# Patient Record
Sex: Male | Born: 1960 | Race: White | Hispanic: No | Marital: Married | Smoking: Never smoker
Health system: Southern US, Community
[De-identification: ages and names within clinical notes are randomized; demographics above are authoritative.]

## PROBLEM LIST (undated history)

## (undated) DIAGNOSIS — M25529 Pain in unspecified elbow: Secondary | ICD-10-CM

## (undated) DIAGNOSIS — K5792 Diverticulitis of intestine, part unspecified, without perforation or abscess without bleeding: Secondary | ICD-10-CM

## (undated) DIAGNOSIS — G8929 Other chronic pain: Secondary | ICD-10-CM

## (undated) DIAGNOSIS — M1990 Unspecified osteoarthritis, unspecified site: Secondary | ICD-10-CM

## (undated) DIAGNOSIS — J302 Other seasonal allergic rhinitis: Secondary | ICD-10-CM

## (undated) DIAGNOSIS — K635 Polyp of colon: Secondary | ICD-10-CM

## (undated) HISTORY — PX: REPAIR EXTENSOR TENDON: SHX5382

## (undated) HISTORY — DX: Other seasonal allergic rhinitis: J30.2

---
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
## 1998-09-04 ENCOUNTER — Emergency Department (HOSPITAL_COMMUNITY): Admission: EM | Admit: 1998-09-04 | Discharge: 1998-09-04 | Payer: Self-pay | Admitting: Internal Medicine

## 1998-10-29 HISTORY — PX: OTHER SURGICAL HISTORY: SHX169

## 1999-08-31 ENCOUNTER — Ambulatory Visit (HOSPITAL_COMMUNITY): Admission: RE | Admit: 1999-08-31 | Discharge: 1999-08-31 | Payer: Self-pay | Admitting: *Deleted

## 1999-08-31 ENCOUNTER — Encounter: Payer: Self-pay | Admitting: *Deleted

## 2000-10-14 ENCOUNTER — Encounter: Payer: Self-pay | Admitting: Gastroenterology

## 2000-10-14 ENCOUNTER — Ambulatory Visit (HOSPITAL_COMMUNITY): Admission: RE | Admit: 2000-10-14 | Discharge: 2000-10-14 | Payer: Self-pay | Admitting: Gastroenterology

## 2004-12-06 ENCOUNTER — Ambulatory Visit: Admission: RE | Admit: 2004-12-06 | Discharge: 2004-12-06 | Payer: Self-pay | Admitting: Family Medicine

## 2008-04-19 HISTORY — PX: OTHER SURGICAL HISTORY: SHX169

## 2010-10-10 ENCOUNTER — Encounter: Payer: Self-pay | Admitting: Gastroenterology

## 2011-02-28 ENCOUNTER — Other Ambulatory Visit: Payer: Self-pay | Admitting: Orthopedic Surgery

## 2011-02-28 ENCOUNTER — Encounter (HOSPITAL_COMMUNITY): Payer: PRIVATE HEALTH INSURANCE | Attending: Orthopedic Surgery

## 2011-02-28 DIAGNOSIS — Z01811 Encounter for preprocedural respiratory examination: Secondary | ICD-10-CM | POA: Insufficient documentation

## 2011-02-28 DIAGNOSIS — Z01812 Encounter for preprocedural laboratory examination: Secondary | ICD-10-CM | POA: Insufficient documentation

## 2011-02-28 DIAGNOSIS — M24159 Other articular cartilage disorders, unspecified hip: Secondary | ICD-10-CM | POA: Insufficient documentation

## 2011-02-28 LAB — CBC
HCT: 41 % (ref 39.0–52.0)
Hemoglobin: 14.5 g/dL (ref 13.0–17.0)
MCH: 31 pg (ref 26.0–34.0)
MCHC: 35.4 g/dL (ref 30.0–36.0)
MCV: 87.6 fL (ref 78.0–100.0)
Platelets: 213 10*3/uL (ref 150–400)
RBC: 4.68 MIL/uL (ref 4.22–5.81)
RDW: 11.8 % (ref 11.5–15.5)
WBC: 5.8 10*3/uL (ref 4.0–10.5)

## 2011-02-28 LAB — URINALYSIS, ROUTINE W REFLEX MICROSCOPIC
Bilirubin Urine: NEGATIVE
Glucose, UA: NEGATIVE mg/dL
Hgb urine dipstick: NEGATIVE
Ketones, ur: NEGATIVE mg/dL
Nitrite: NEGATIVE
Protein, ur: NEGATIVE mg/dL
Specific Gravity, Urine: 1.005 (ref 1.005–1.030)
Urobilinogen, UA: 0.2 mg/dL (ref 0.0–1.0)
pH: 6.5 (ref 5.0–8.0)

## 2011-02-28 LAB — APTT: aPTT: 28 seconds (ref 24–37)

## 2011-02-28 LAB — COMPREHENSIVE METABOLIC PANEL
ALT: 23 U/L (ref 0–53)
AST: 21 U/L (ref 0–37)
Albumin: 4.5 g/dL (ref 3.5–5.2)
Alkaline Phosphatase: 68 U/L (ref 39–117)
BUN: 9 mg/dL (ref 6–23)
CO2: 26 mEq/L (ref 19–32)
Calcium: 9.5 mg/dL (ref 8.4–10.5)
Chloride: 97 mEq/L (ref 96–112)
Creatinine, Ser: 0.85 mg/dL (ref 0.4–1.5)
GFR calc Af Amer: >60 mL/min (ref >60)
GFR calc non Af Amer: >60 mL/min (ref >60)
Glucose, Bld: 85 mg/dL (ref 70–99)
Potassium: 4.3 mEq/L (ref 3.5–5.1)
Sodium: 134 mEq/L — ABNORMAL LOW (ref 135–145)
Total Bilirubin: 0.6 mg/dL (ref 0.3–1.2)
Total Protein: 7.3 g/dL (ref 6.0–8.3)

## 2011-02-28 LAB — PROTIME-INR
INR: 0.95 (ref 0.00–1.49)
Prothrombin Time: 12.9 seconds (ref 11.6–15.2)

## 2011-02-28 LAB — SURGICAL PCR SCREEN
MRSA, PCR: NEGATIVE
Staphylococcus aureus: NEGATIVE

## 2011-03-09 ENCOUNTER — Ambulatory Visit (HOSPITAL_COMMUNITY)
Admission: RE | Admit: 2011-03-09 | Discharge: 2011-03-09 | Disposition: A | Discharge status: Home / Self Care | Payer: PRIVATE HEALTH INSURANCE | Source: Ambulatory Visit | Attending: Orthopedic Surgery | Admitting: Orthopedic Surgery

## 2011-03-09 ENCOUNTER — Ambulatory Visit (HOSPITAL_COMMUNITY): Payer: PRIVATE HEALTH INSURANCE

## 2011-03-09 DIAGNOSIS — M25559 Pain in unspecified hip: Secondary | ICD-10-CM

## 2011-03-09 DIAGNOSIS — M24159 Other articular cartilage disorders, unspecified hip: Secondary | ICD-10-CM | POA: Insufficient documentation

## 2011-03-09 DIAGNOSIS — M9420 Chondromalacia, unspecified site: Secondary | ICD-10-CM | POA: Insufficient documentation

## 2011-03-09 HISTORY — PX: HIP ARTHROSCOPY W/ LABRAL DEBRIDEMENT: SHX1749

## 2011-03-14 NOTE — Op Note (Signed)
Douglas Shah, Douglas Shah               ACCOUNT NO.:  [id number]  MEDICAL RECORD NO.:  [id number]           PATIENT TYPE:  O  LOCATION:  DAYL                         FACILITY:  [facility]  PHYSICIAN:  Ollen Gross, M.D.    DATE OF BIRTH:  1961-07-04  DATE OF PROCEDURE:  03/09/2011 DATE OF DISCHARGE:                              OPERATIVE REPORT   PREOPERATIVE DIAGNOSIS:  Right hip labral tear.  POSTOPERATIVE DIAGNOSES:  Right hip labral tear plus chondral defect, acetabulum.  PROCEDURE:  Right hip arthroscopy with labral debridement and chondroplasty.  SURGEON:  Ollen Gross, M.D.  ASSISTANT:  Avel Peace, PA-C  ANESTHESIA:  General.  ESTIMATED BLOOD LOSS:  Minimal.  DRAINS:  None.  COMPLICATIONS:  None.  CONDITION:  Stable to recovery room.  CLINICAL NOTE:  Douglas Shah is a 50 year old male, avid runner, who has developed significant right hip pain with mechanical type symptoms. Exam and history suggested labral tear confirmed by MRI.  He presents for arthroscopy and debridement for his repair.  PROCEDURE IN DETAIL:  After successful administration of general anesthetic, the patient is placed in left lateral decubitus position with the right side up.  His right lower extremity is draped over the well-padded perineal post and foot placed into the well-padded foot holder.  The lower leg is well padded also.  Under fluoroscopic guidance, traction is then applied to the right lower extremity to adequately distract the hip.  Once adequately distracted, the traction is locked in this position.  C-arm was then removed.  The thigh is then prepped and draped in the usual sterile fashion.  Spinal needles were passed to the sites marked for the anterior posterior peritrochanteric portals.  Saline is then passed through the posterior needle and his outflow through the anterior needle confirming both are intra-articular. Small incisions were made around posterior needle.  The introducer  is passed into the joint and then the cannula.  Camera is then passed through the cannula and arthroscopic visualization proceeds.  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  There is some grade 1 and 2 chondromalacia throughout the anterior aspect acetabulum, and then at the labral-chondral junction, there is a chondral delamination from about the 2 o'clock to the 5 o'clock position.  The spinal needle was felt to be in good position anteriorly, so I then created the portal anteriorly.  The unstable cartilage is debrided back to a stable bony base with stable cartilaginous edges.  There is some fraying of the labrum with a small tear but it is not detached.  His anatomy does not have any acetabular spurring, so I felt it was not necessary to remove the labrum, takes spurs, and reattach.  The labrum had a firm attachment, so we left it intact.  I did debride the tear down to a stable base.  We then shaved down the exposed bone and the acetabulum.  The entire area is about 1 x 2 cm along the edge from the 2 o'clock to 5 o'clock position.  Rest of the joints inspected, and rest of the cartilage looks normal.  The camera is removed and then the  20 cc of 0.25% Marcaine with epinephrine injected through the inflow cannula into the joint.  That is removed and traction is taken off the joint. The incision is then closed with interrupted 4-0 nylon.  The incision is clean and dry, and a bulky sterile dressing is applied.  He is then awakened and transported to recovery in stable condition.     Ollen Gross, M.D.     FA/MEDQ  D:  03/09/2011  T:  03/09/2011  Job:  [id number]  Electronically Signed by Ollen Gross M.D. on 03/14/2011 10:13:03 AM

## 2011-04-23 NOTE — Letter (Signed)
Summary: Colonoscopy Date Change Letter  [facility]  [address]   [place], [place] [address]   Phone: [phone]  Fax: [phone]      October 10, 2010 MRN: [id number]   [facility] [address] [place], [place]  [address]   Dear Douglas Shah,   Previously you were recommended to have a repeat colonoscopy around this time. Your chart was recently reviewed by Dr. Russella Dar of [facility]. Follow up colonoscopy is now recommended in September 2012. This revised recommendation is based on current, nationally recognized guidelines for colorectal cancer screening and polyp surveillance. These guidelines are endorsed by the American Cancer Society, [employer] on Colorectal Cancer as well as numerous other major medical organizations.  Please understand that our recommendation assumes that you do not have any new symptoms such as bleeding, a change in bowel habits, anemia, or significant abdominal discomfort. If you do have any concerning GI symptoms or want to discuss the guideline recommendations, please call to arrange an office visit at your earliest convenience. Otherwise we will keep you in our reminder system and contact you 1-2 months prior to the date listed above to schedule your next colonoscopy.  Thank you,   Claudette Head, M.D.  [facility] [phone]

## 2011-07-31 ENCOUNTER — Encounter: Payer: Self-pay | Admitting: Gastroenterology

## 2012-07-22 ENCOUNTER — Encounter: Payer: Self-pay | Admitting: Gastroenterology

## 2012-08-04 ENCOUNTER — Other Ambulatory Visit: Payer: Self-pay | Admitting: Orthopedic Surgery

## 2012-08-12 ENCOUNTER — Encounter (HOSPITAL_BASED_OUTPATIENT_CLINIC_OR_DEPARTMENT_OTHER): Payer: Self-pay | Admitting: *Deleted

## 2012-08-12 NOTE — Progress Notes (Signed)
NPO AFTER MN WITH CLEAR LIQUIDS UNTIL 0800 AND NO SNUFF (NO CREAM/ MILK PRODUCTS) . ARRIVES AT 1215. NEEDS HG.

## 2012-08-15 ENCOUNTER — Encounter (HOSPITAL_BASED_OUTPATIENT_CLINIC_OR_DEPARTMENT_OTHER): Payer: Self-pay | Admitting: Anesthesiology

## 2012-08-15 ENCOUNTER — Ambulatory Visit (HOSPITAL_BASED_OUTPATIENT_CLINIC_OR_DEPARTMENT_OTHER)
Admission: RE | Admit: 2012-08-15 | Discharge: 2012-08-15 | Disposition: A | Discharge status: Home / Self Care | Payer: PRIVATE HEALTH INSURANCE | Source: Ambulatory Visit | Attending: Orthopedic Surgery | Admitting: Orthopedic Surgery

## 2012-08-15 ENCOUNTER — Ambulatory Visit (HOSPITAL_BASED_OUTPATIENT_CLINIC_OR_DEPARTMENT_OTHER): Payer: PRIVATE HEALTH INSURANCE | Admitting: Anesthesiology

## 2012-08-15 ENCOUNTER — Encounter (HOSPITAL_BASED_OUTPATIENT_CLINIC_OR_DEPARTMENT_OTHER): Payer: Self-pay | Admitting: *Deleted

## 2012-08-15 ENCOUNTER — Encounter (HOSPITAL_BASED_OUTPATIENT_CLINIC_OR_DEPARTMENT_OTHER)
Admission: RE | Disposition: A | Discharge status: Home / Self Care | Payer: Self-pay | Source: Ambulatory Visit | Attending: Orthopedic Surgery

## 2012-08-15 DIAGNOSIS — R29898 Other symptoms and signs involving the musculoskeletal system: Secondary | ICD-10-CM

## 2012-08-15 DIAGNOSIS — M7710 Lateral epicondylitis, unspecified elbow: Secondary | ICD-10-CM | POA: Insufficient documentation

## 2012-08-15 HISTORY — PX: REPAIR EXTENSOR TENDON: SHX5382

## 2012-08-15 HISTORY — DX: Pain in unspecified elbow: M25.529

## 2012-08-15 HISTORY — DX: Unspecified osteoarthritis, unspecified site: M19.90

## 2012-08-15 HISTORY — DX: Other chronic pain: G89.29

## 2012-08-15 LAB — POCT HEMOGLOBIN-HEMACUE: Hemoglobin: 15.3 g/dL (ref 13.0–17.0)

## 2012-08-15 SURGERY — REPAIR, TENDON, EXTENSOR
Anesthesia: General | Site: Elbow | Laterality: Left | Wound class: Clean

## 2012-08-15 MED ORDER — POVIDONE-IODINE 7.5 % EX SOLN: Freq: Once | CUTANEOUS | Status: DC

## 2012-08-15 MED ORDER — DEXAMETHASONE SODIUM PHOSPHATE 4 MG/ML IJ SOLN
INTRAMUSCULAR | Status: DC | PRN
  Administered 2012-08-15: 10 mg via INTRAVENOUS

## 2012-08-15 MED ORDER — OXYCODONE HCL 5 MG/5ML PO SOLN: 5.0000 mg | Freq: Once | ORAL | Status: DC | PRN

## 2012-08-15 MED ORDER — LIDOCAINE HCL (CARDIAC) 20 MG/ML IV SOLN
INTRAVENOUS | Status: DC | PRN
  Administered 2012-08-15: 75 mg via INTRAVENOUS

## 2012-08-15 MED ORDER — PROPOFOL 10 MG/ML IV BOLUS
INTRAVENOUS | Status: DC | PRN
  Administered 2012-08-15: 250 mg via INTRAVENOUS

## 2012-08-15 MED ORDER — LACTATED RINGERS IV SOLN
INTRAVENOUS | Status: DC
  Administered 2012-08-15 (×3): via INTRAVENOUS

## 2012-08-15 MED ORDER — BUPIVACAINE HCL 0.5 % IJ SOLN
INTRAMUSCULAR | Status: DC | PRN
  Administered 2012-08-15: 6 mL

## 2012-08-15 MED ORDER — MEPERIDINE HCL 25 MG/ML IJ SOLN: 6.2500 mg | INTRAMUSCULAR | Status: DC | PRN

## 2012-08-15 MED ORDER — METHOCARBAMOL 500 MG PO TABS: 500.0000 mg | ORAL_TABLET | Freq: Four times a day (QID) | ORAL | Status: DC

## 2012-08-15 MED ORDER — OXYCODONE-ACETAMINOPHEN 5-325 MG PO TABS
1.0000 | ORAL_TABLET | ORAL | Status: DC | PRN
  Administered 2012-08-15: 1 via ORAL

## 2012-08-15 MED ORDER — OXYCODONE HCL 5 MG PO TABS
5.0000 mg | ORAL_TABLET | Freq: Once | ORAL | Status: AC | PRN
  Administered 2012-08-15: 2.5 mg via ORAL

## 2012-08-15 MED ORDER — MIDAZOLAM HCL 5 MG/5ML IJ SOLN
INTRAMUSCULAR | Status: DC | PRN
  Administered 2012-08-15: 1 mg via INTRAVENOUS

## 2012-08-15 MED ORDER — ACETAMINOPHEN 10 MG/ML IV SOLN: 1000.0000 mg | Freq: Once | INTRAVENOUS | Status: DC | PRN

## 2012-08-15 MED ORDER — OXYCODONE HCL 5 MG/5ML PO SOLN: 5.0000 mg | Freq: Once | ORAL | Status: AC | PRN

## 2012-08-15 MED ORDER — OXYCODONE-ACETAMINOPHEN 7.5-325 MG PO TABS: 1.0000 | ORAL_TABLET | ORAL | Status: DC | PRN

## 2012-08-15 MED ORDER — OXYCODONE HCL 5 MG PO TABS: 5.0000 mg | ORAL_TABLET | Freq: Once | ORAL | Status: DC | PRN

## 2012-08-15 MED ORDER — HYDROMORPHONE HCL PF 1 MG/ML IJ SOLN
0.2500 mg | INTRAMUSCULAR | Status: DC | PRN
  Administered 2012-08-15: 0.5 mg via INTRAVENOUS

## 2012-08-15 MED ORDER — ONDANSETRON HCL 4 MG/2ML IJ SOLN
INTRAMUSCULAR | Status: DC | PRN
  Administered 2012-08-15: 4 mg via INTRAVENOUS

## 2012-08-15 MED ORDER — OXYCODONE HCL 5 MG PO TABS: 5.0000 mg | ORAL_TABLET | Freq: Once | ORAL | Status: DC

## 2012-08-15 MED ORDER — PROMETHAZINE HCL 25 MG/ML IJ SOLN: 6.2500 mg | INTRAMUSCULAR | Status: DC | PRN

## 2012-08-15 MED ORDER — HYDROMORPHONE HCL PF 1 MG/ML IJ SOLN: 0.2500 mg | INTRAMUSCULAR | Status: DC | PRN

## 2012-08-15 MED ORDER — FENTANYL CITRATE 0.05 MG/ML IJ SOLN
INTRAMUSCULAR | Status: DC | PRN
  Administered 2012-08-15: 25 ug via INTRAVENOUS
  Administered 2012-08-15: 50 ug via INTRAVENOUS
  Administered 2012-08-15 (×3): 25 ug via INTRAVENOUS
  Administered 2012-08-15: 50 ug via INTRAVENOUS

## 2012-08-15 MED ORDER — KETOROLAC TROMETHAMINE 30 MG/ML IJ SOLN
INTRAMUSCULAR | Status: DC | PRN
  Administered 2012-08-15: 30 mg via INTRAVENOUS

## 2012-08-15 SURGICAL SUPPLY — 62 items
BANDAGE CONFORM 3  STR LF (GAUZE/BANDAGES/DRESSINGS) IMPLANT
BANDAGE ELASTIC 3 VELCRO ST LF (GAUZE/BANDAGES/DRESSINGS) IMPLANT
BANDAGE ELASTIC 4 VELCRO ST LF (GAUZE/BANDAGES/DRESSINGS) ×1 IMPLANT
BANDAGE ELASTIC 6 VELCRO ST LF (GAUZE/BANDAGES/DRESSINGS) ×1 IMPLANT
BLADE SURG 15 STRL LF DISP TIS (BLADE) ×1 IMPLANT
BLADE SURG 15 STRL SS (BLADE) ×4
BNDG CMPR 9X4 STRL LF SNTH (GAUZE/BANDAGES/DRESSINGS) ×1
BNDG COHESIVE 4X5 TAN NS LF (GAUZE/BANDAGES/DRESSINGS) ×2 IMPLANT
BNDG ESMARK 4X9 LF (GAUZE/BANDAGES/DRESSINGS) ×2 IMPLANT
CANISTER SUCTION 1200CC (MISCELLANEOUS) ×3 IMPLANT
CANISTER SUCTION 2500CC (MISCELLANEOUS) ×1 IMPLANT
CLOTH BEACON ORANGE TIMEOUT ST (SAFETY) ×2 IMPLANT
CORDS BIPOLAR (ELECTRODE) ×2 IMPLANT
COVER TABLE BACK 60X90 (DRAPES) ×2 IMPLANT
DRAPE EXTREMITY T 121X128X90 (DRAPE) ×2 IMPLANT
DRAPE LG THREE QUARTER DISP (DRAPES) ×4 IMPLANT
DRAPE U-SHAPE 47X51 STRL (DRAPES) ×2 IMPLANT
DRSG ADAPTIC 3X8 NADH LF (GAUZE/BANDAGES/DRESSINGS) ×1 IMPLANT
DRSG EMULSION OIL 3X3 NADH (GAUZE/BANDAGES/DRESSINGS) ×1 IMPLANT
DRSG PAD ABDOMINAL 8X10 ST (GAUZE/BANDAGES/DRESSINGS) ×1 IMPLANT
DURAPREP 26ML APPLICATOR (WOUND CARE) ×2 IMPLANT
ELECT REM PT RETURN 9FT ADLT (ELECTROSURGICAL) ×2
ELECTRODE REM PT RTRN 9FT ADLT (ELECTROSURGICAL) IMPLANT
GAUZE SPONGE 4X4 12PLY STRL LF (GAUZE/BANDAGES/DRESSINGS) IMPLANT
GLOVE ECLIPSE 8.0 STRL XLNG CF (GLOVE) ×2 IMPLANT
GLOVE INDICATOR 6.5 STRL GRN (GLOVE) ×2 IMPLANT
GLOVE INDICATOR 8.0 STRL GRN (GLOVE) ×4 IMPLANT
GLOVE LITE  25/BX (GLOVE) ×1 IMPLANT
GOWN PREVENTION PLUS LG XLONG (DISPOSABLE) ×2 IMPLANT
GOWN STRL REIN XL XLG (GOWN DISPOSABLE) ×2 IMPLANT
NEEDLE 27GAX1X1/2 (NEEDLE) IMPLANT
NEEDLE HYPO 22GX1.5 SAFETY (NEEDLE) ×1 IMPLANT
NS IRRIG 500ML POUR BTL (IV SOLUTION) ×2 IMPLANT
PACK BASIN DAY SURGERY FS (CUSTOM PROCEDURE TRAY) ×2 IMPLANT
PADDING CAST ABS 4INX4YD NS (CAST SUPPLIES) ×1
PADDING CAST ABS COTTON 4X4 ST (CAST SUPPLIES) IMPLANT
PENCIL BUTTON HOLSTER BLD 10FT (ELECTRODE) ×1 IMPLANT
SLING ARM FOAM STRAP LRG (SOFTGOODS) ×1 IMPLANT
SPLINT FAST PLASTER 5X30 (CAST SUPPLIES) ×2
SPLINT PLASTER CAST FAST 5X30 (CAST SUPPLIES) IMPLANT
SPONGE GAUZE 2X2 8PLY STRL LF (GAUZE/BANDAGES/DRESSINGS) ×1 IMPLANT
SPONGE GAUZE 4X4 12PLY (GAUZE/BANDAGES/DRESSINGS) ×2 IMPLANT
STOCKINETTE 4X48 STRL (DRAPES) ×2 IMPLANT
STOCKINETTE IMPERVIOUS LG (DRAPES) ×2 IMPLANT
SUCTION FRAZIER TIP 10 FR DISP (SUCTIONS) ×1 IMPLANT
SUT ETHILON 4 0 PS 2 18 (SUTURE) ×2 IMPLANT
SUT ETHILON 5 0 PS 2 18 (SUTURE) IMPLANT
SUT VIC AB 0 CT1 36 (SUTURE) ×1 IMPLANT
SUT VIC AB 2-0 SH 27 (SUTURE) ×2
SUT VIC AB 2-0 SH 27XBRD (SUTURE) IMPLANT
SUT VIC AB 2-0 UR6 27 (SUTURE) IMPLANT
SUT VIC AB 3-0 PS2 18 (SUTURE)
SUT VIC AB 3-0 PS2 18XBRD (SUTURE) IMPLANT
SUT VIC AB 4-0 SH 27 (SUTURE)
SUT VIC AB 4-0 SH 27XANBCTRL (SUTURE) IMPLANT
SUT VICRYL 0 27 CT2 27 ABS (SUTURE) ×1 IMPLANT
SYR BULB IRRIGATION 50ML (SYRINGE) ×1 IMPLANT
SYR CONTROL 10ML LL (SYRINGE) ×1 IMPLANT
TOWEL OR 17X24 6PK STRL BLUE (TOWEL DISPOSABLE) ×4 IMPLANT
TUBE CONNECTING 12X1/4 (SUCTIONS) ×1 IMPLANT
UNDERPAD 30X30 INCONTINENT (UNDERPADS AND DIAPERS) ×2 IMPLANT
WATER STERILE IRR 500ML POUR (IV SOLUTION) ×1 IMPLANT

## 2012-08-15 NOTE — Transfer of Care (Signed)
Immediate Anesthesia Transfer of Care Note  Patient: Douglas Shah  Procedure(s) Performed: Procedure(s) (LRB): REPAIR EXTENSOR TENDON (Left)  Patient Location: Patient transported to PACU with oxygen via face mask at 4 Liters / Min  Anesthesia Type: General  Level of Consciousness: awake and alert   Airway & Oxygen Therapy: Patient Spontanous Breathing and Patient connected to face mask oxygen  Post-op Assessment: Report given to PACU RN and Post -op Vital signs reviewed and stable  Post vital signs: Reviewed and stable  Dentition: Teeth and oropharynx remain in pre-op condition  Complications: No apparent anesthesia complications

## 2012-08-15 NOTE — Brief Op Note (Signed)
08/15/2012  4:20 PM  PATIENT:  Douglas Shah  51 y.o. 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  PROCEDURE:  Procedure(s) (LRB) with comments: REPAIR EXTENSOR TENDON (Left) - WITH PARTIAL LATERAL EPICONDYLECTOMY OF THE LEFT ELBOW   SURGEON:  Surgeon(s) and Role:    * Drucilla Schmidt, MD - Primary  PHYSICIAN ASSISTANT:   ASSISTANTS:nurse  ANESTHESIA:   local and general  EBL:  Total I/O In: 1400 [I.V.:1400] Out: -   BLOOD ADMINISTERED:none  DRAINS: none   LOCAL MEDICATIONS USED:  MARCAINE     SPECIMEN:  No Specimen  DISPOSITION OF SPECIMEN:  N/A  COUNTS:    TOURNIQUET:   Total Tourniquet Time Documented: Upper Arm (Left) - 71 minutes  DICTATION: .Other Dictation: Dictation Number [id number]  PLAN OF CARE: Discharge to home after PACU  PATIENT DISPOSITION:  PACU - hemodynamically stable.   Delay start of Pharmacological VTE agent (>24hrs) due to surgical blood loss or risk of bleeding: not applicable

## 2012-08-15 NOTE — H&P (Signed)
Douglas Shah is an 51 y.o. male.   Chief Complaint: painful lateral lt elbow HPI: persistent pain and tenderness lateral lt elbow unresponsive to non surgical treatment  Past Medical History  Diagnosis Date  . Chronic elbow pain left  . Arthritis     Past Surgical History  Procedure Date  . Hip arthroscopy w/ labral debridement 03-09-2011    RIGHT HIP  . Right shoulder surgery 2000  . Exercise treadmill stress test 04-19-2008    LOW RISK/ NO ISCHEMIA    History reviewed. No pertinent family history. Social History:  reports that he has never smoked. His smokeless tobacco use includes Snuff. He reports that he drinks about 6 ounces of alcohol per week. He reports that he does not use illicit drugs.  Allergies: No Known Allergies  Medications Prior to Admission  Medication Sig Dispense Refill  . fish oil-omega-3 fatty acids 1000 MG capsule Take 2 g by mouth daily.      . naproxen sodium (ANAPROX) 220 MG tablet Take 220 mg by mouth as needed.        Results for orders placed during the hospital encounter of 08/15/12 (from the past 48 hour(s))  POCT HEMOGLOBIN-HEMACUE     Status: Normal   Collection Time   08/15/12 12:51 PM      Component Value Range Comment   Hemoglobin 15.3  13.0 - 17.0 g/dL    No results found.  ROS  Blood pressure 108/77, pulse 65, temperature 97.6 F (36.4 C), temperature source Oral, resp. rate 18, height 5\' 8"  (1.727 m), weight 72.576 kg (160 lb), SpO2 99.00%. Physical Exam  Constitutional: He is oriented to person, place, and time. He appears well-developed and well-nourished.  HENT:  Head: Normocephalic and atraumatic.  Right Ear: External ear normal.  Left Ear: External ear normal.  Nose: Nose normal.  Mouth/Throat: Oropharynx is clear and moist.  Eyes: Conjunctivae normal and EOM are normal. Pupils are equal, round, and reactive to light.  Neck: Neck supple.  Cardiovascular: Normal rate, regular rhythm, normal heart sounds and intact  distal pulses.   Respiratory: Breath sounds normal.  GI: Soft. Bowel sounds are normal.  Musculoskeletal: He exhibits tenderness.       Tender lateral epicondyle lt elbow  Neurological: He is alert and oriented to person, place, and time. He has normal reflexes.  Skin: Skin is warm and dry.  Psychiatric: He has a normal mood and affect. His behavior is normal. Judgment and thought content normal.     Assessment/Plan Chronic lateral epicondylitis lt elbow Repair common extensor tendon lateral lt elboe  APLINGTON,JAMES P 08/15/2012, 2:27 PM

## 2012-08-15 NOTE — Anesthesia Preprocedure Evaluation (Addendum)
Anesthesia Evaluation  Patient identified by MRN, date of birth, ID band Patient awake    Reviewed: Allergy & Precautions, H&P , NPO status , Patient's Chart, lab work & pertinent test results  Airway Mallampati: I TM Distance: >3 FB Neck ROM: Full    Dental  (+) Dental Advisory Given and Teeth Intact   Pulmonary neg pulmonary ROS,  breath sounds clear to auscultation  Pulmonary exam normal       Cardiovascular Exercise Tolerance: Good - CAD Rhythm:Regular Rate:Normal     Neuro/Psych negative neurological ROS  negative psych ROS   GI/Hepatic negative GI ROS, Neg liver ROS,   Endo/Other  negative endocrine ROS  Renal/GU negative Renal ROS     Musculoskeletal  (+) Arthritis -, Osteoarthritis,    Abdominal   Peds  Hematology negative hematology ROS (+)   Anesthesia Other Findings   Reproductive/Obstetrics                          Anesthesia Physical Anesthesia Plan  ASA: I  Anesthesia Plan: General   Post-op Pain Management:    Induction: Intravenous  Airway Management Planned: LMA  Additional Equipment:   Intra-op Plan:   Post-operative Plan: Extubation in OR  Informed Consent: I have reviewed the patients History and Physical, chart, labs and discussed the procedure including the risks, benefits and alternatives for the proposed anesthesia with the patient or authorized representative who has indicated his/her understanding and acceptance.   Dental advisory given  Plan Discussed with: CRNA  Anesthesia Plan Comments:         Anesthesia Quick Evaluation

## 2012-08-15 NOTE — Anesthesia Procedure Notes (Signed)
Procedure Name: LMA Insertion Date/Time: 08/15/2012 2:39 PM Performed by: Fran Lowes Pre-anesthesia Checklist: Patient identified, Emergency Drugs available, Suction available and Patient being monitored Patient Re-evaluated:Patient Re-evaluated prior to inductionOxygen Delivery Method: Circle System Utilized Preoxygenation: Pre-oxygenation with 100% oxygen Intubation Type: IV induction Ventilation: Mask ventilation without difficulty LMA: LMA inserted LMA Size: 4.0 Number of attempts: 1 Airway Equipment and Method: bite block Placement Confirmation: positive ETCO2 Tube secured with: Tape Dental Injury: Teeth and Oropharynx as per pre-operative assessment

## 2012-08-17 NOTE — Anesthesia Postprocedure Evaluation (Signed)
Anesthesia Post Note  Patient: Douglas Shah  Procedure(s) Performed: Procedure(s) (LRB): REPAIR EXTENSOR TENDON (Left)  Anesthesia type: General  Patient location: PACU  Post pain: Pain level controlled  Post assessment: Post-op Vital signs reviewed  Last Vitals:  Filed Vitals:   08/15/12 1741  BP: 136/90  Pulse: 72  Temp: 36.1 C  Resp: 18    Post vital signs: Reviewed  Level of consciousness: sedated  Complications: No apparent anesthesia complications

## 2012-08-18 ENCOUNTER — Encounter (HOSPITAL_BASED_OUTPATIENT_CLINIC_OR_DEPARTMENT_OTHER): Payer: Self-pay | Admitting: Orthopedic Surgery

## 2012-08-18 NOTE — Op Note (Signed)
NAMESAN, Douglas Shah               ACCOUNT NO.:  [id number]  MEDICAL RECORD NO.:  [id number]  LOCATION:                                 FACILITY:  PHYSICIAN:  Marlowe Kays, M.D.  DATE OF BIRTH:  1961/08/26  DATE OF PROCEDURE:  08/15/2012 DATE OF DISCHARGE:                              OPERATIVE REPORT   PREOPERATIVE DIAGNOSIS:  Chronic lateral epicondylitis, left elbow  POSTOPERATIVE DIAGNOSIS:  Chronic lateral epicondylitis left elbow.  OPERATION:  Reconstruction, common extensor tendon, lateral left elbow with partial lateral epicondylectomy.  SURGEON:  Marlowe Kays, M.D.  ASSISTANT:  Nurse.  ANESTHESIA:  General.  PATHOLOGY AND JUSTIFICATION FOR PROCEDURE:  Chronic pain, tenderness over the lateral left elbow.  He does repetitive use of his arm.  He has failed all nonsurgical treatment.  PROCEDURE:  Satisfactory general anesthesia, pneumatic tourniquet, left upper extremity with the arm Esmarched out nonsterilely and tourniquet inflated to 250 mmHg.  The arm was then prepped with DuraPrep from tourniquet to wrist and draped in sterile field.  Time-out performed.  I made a curved incision midway between the lateral epicondyle and the olecranon, carried down through the thin fascia to expose the common extensor tendon, which was quite injected and appeared to be thinned out at the lateral epicondyle.  I made a horizontal U-shaped incision on either side of the common extensor tendon dissecting it off the lateral epicondyle and had a very yellowish degenerated appearance to it, and I debrided out all this diseased tendon back to the joint, which looked normal.  I then worked with a osteotome.  I then decorticated the lateral epicondyle and also removed some of the superficial cortical bone distal to this, so that the whole lateral epicondylar area had some nice healthy raw bone.  I made 2 parallel drill holes through the cut, lateral epicondylar area out proximally  and then working through the bottom holes first using 0 Vicryl with tendon through the hole from proximal to distal weaving it through the tendon and then back out through the superior hole from distal to proximal, and then held this with tension as I reapproximated the U portions of the incision with multiple interrupted 0 Vicryl.  I then tied the proximal suture through the bone and placed 2 additional sutures at the terminal end of the tendon, all this with the arm in flexion.  I then infiltrated all soft tissues with 0.5% plain Marcaine and irrigated the wound.  I then closed the thin fascia with interrupted 3-0 Vicryl and subcutaneous tissue with the same.  The skin was closed with interrupted 4-0 nylon mattress sutures. Betadine, Adaptic, dry sterile dressing and a well-padded long-arm splint cast were applied.  Tourniquet was released.  He tolerated the procedure well and was taken to the recovery room in satisfactory condition with no known complications.          ______________________________ Marlowe Kays, M.D.     JA/MEDQ  D:  08/15/2012  T:  08/16/2012  Job:  [id number]

## 2012-12-17 ENCOUNTER — Encounter: Payer: Self-pay | Admitting: Gastroenterology

## 2013-02-02 ENCOUNTER — Ambulatory Visit (AMBULATORY_SURGERY_CENTER): Payer: PRIVATE HEALTH INSURANCE | Admitting: *Deleted

## 2013-02-02 VITALS — Ht 68.0 in | Wt 163.0 lb

## 2013-02-02 DIAGNOSIS — Z1211 Encounter for screening for malignant neoplasm of colon: Secondary | ICD-10-CM

## 2013-02-02 MED ORDER — MOVIPREP 100 G PO SOLR: ORAL | Status: DC

## 2013-02-16 ENCOUNTER — Ambulatory Visit (AMBULATORY_SURGERY_CENTER): Payer: PRIVATE HEALTH INSURANCE | Admitting: Gastroenterology

## 2013-02-16 ENCOUNTER — Encounter: Payer: Self-pay | Admitting: Gastroenterology

## 2013-02-16 VITALS — BP 130/80 | HR 56 | Temp 97.7°F | Resp 15 | Ht 68.0 in | Wt 163.0 lb

## 2013-02-16 DIAGNOSIS — Z1211 Encounter for screening for malignant neoplasm of colon: Secondary | ICD-10-CM

## 2013-02-16 DIAGNOSIS — D126 Benign neoplasm of colon, unspecified: Secondary | ICD-10-CM

## 2013-02-16 MED ORDER — SODIUM CHLORIDE 0.9 % IV SOLN: 500.0000 mL | INTRAVENOUS | Status: DC

## 2013-02-16 NOTE — Progress Notes (Signed)
Patient did not experience any of the following events: a burn prior to discharge; a fall within the facility; wrong site/side/patient/procedure/implant event; or a hospital transfer or hospital admission upon discharge from the facility. (G8907) Patient did not have preoperative order for IV antibiotic SSI prophylaxis. (G8918)  

## 2013-02-16 NOTE — Patient Instructions (Addendum)

## 2013-02-16 NOTE — Op Note (Signed)
[facility] [address] [place], [address]   COLONOSCOPY PROCEDURE REPORT  PATIENT: Douglas Shah, Douglas Shah  MR#: [id number] BIRTHDATE: February 18, 1961 , 51  yrs. old GENDER: Male ENDOSCOPIST: Meryl Dare, MD, [facility] REFERRED UX:[id number] Cliffton Asters, M.D. PROCEDURE DATE:  02/16/2013 PROCEDURE:   Colonoscopy with snare polypectomy ASA CLASS:   Class II INDICATIONS:average risk screening. MEDICATIONS: MAC sedation, administered by CRNA, propofol (Diprivan) 300mg  IV DESCRIPTION OF PROCEDURE:   After the risks benefits and alternatives of the procedure were thoroughly explained, informed consent was obtained.  A digital rectal exam revealed no abnormalities of the rectum.   The LB [id number]  endoscope was introduced through the anus and advanced to the cecum, which was identified by both the appendix and ileocecal valve. No adverse events experienced.   The quality of the prep was excellent, using MoviPrep  The instrument was then slowly withdrawn as the colon was fully examined.  COLON FINDINGS: A pedunculated polyp measuring 8 mm in size was found in the descending colon.  A polypectomy was performed using snare cautery.  The resection was complete and the polyp tissue was completely retrieved.   The colon was otherwise normal.  There was no diverticulosis, inflammation, polyps or cancers unless previously stated.  Retroflexed views revealed small internal hemorrhoids. The time to cecum=1 minutes 40 seconds.  Withdrawal time=10 minutes 08 seconds.  The scope was withdrawn and the procedure completed.  COMPLICATIONS: There were no complications.  ENDOSCOPIC IMPRESSION: 1.   Pedunculated polyp measuring 8 mm in the descending colon; polypectomy performed using snare cautery 2.   Small internal hemorrhoids  RECOMMENDATIONS: 1.  Await pathology results 2.  Repeat colonoscopy in 5 years if polyp adenomatous; otherwise 10 years 3.  Hold aspirin, aspirin products,  and anti-inflammatory medication for 2 weeks.   eSigned:  Meryl Dare, MD, [facility] 02/16/2013 8:48 AM

## 2013-02-16 NOTE — Progress Notes (Signed)
No allergies to eggs or soy per pt. 

## 2013-02-16 NOTE — Progress Notes (Signed)
Called to room to assist during endoscopic procedure.  Patient ID and intended procedure confirmed with present staff. Received instructions for my participation in the procedure from the performing physician.  

## 2013-02-17 ENCOUNTER — Telehealth: Payer: Self-pay | Admitting: *Deleted

## 2013-02-17 NOTE — Telephone Encounter (Signed)
No answer, left message to call if questions or concerns. 

## 2013-02-19 ENCOUNTER — Encounter: Payer: Self-pay | Admitting: Gastroenterology

## 2015-08-17 ENCOUNTER — Encounter: Payer: Self-pay | Admitting: *Deleted

## 2015-08-17 ENCOUNTER — Emergency Department
Admission: EM | Admit: 2015-08-17 | Discharge: 2015-08-17 | Disposition: A | Discharge status: Home / Self Care | Payer: 59 | Source: Home / Self Care | Attending: Family Medicine | Admitting: Family Medicine

## 2015-08-17 ENCOUNTER — Emergency Department (INDEPENDENT_AMBULATORY_CARE_PROVIDER_SITE_OTHER): Payer: 59

## 2015-08-17 DIAGNOSIS — M545 Low back pain, unspecified: Secondary | ICD-10-CM

## 2015-08-17 DIAGNOSIS — G9529 Other cord compression: Secondary | ICD-10-CM

## 2015-08-17 MED ORDER — PREDNISONE 20 MG PO TABS: 20.0000 mg | ORAL_TABLET | Freq: Two times a day (BID) | ORAL | Status: DC

## 2015-08-17 NOTE — Discharge Instructions (Signed)
Apply ice pack for 20 to 30 minutes, 3 to 4 times daily  Continue until pain decreases.    Back Pain, Adult Back pain is very common in adults.The cause of back pain is rarely dangerous and the pain often gets better over time.The cause of your back pain may not be known. Some common causes of back pain include:  Strain of the muscles or ligaments supporting the spine.  Wear and tear (degeneration) of the spinal disks.  Arthritis.  Direct injury to the back. For many people, back pain may return. Since back pain is rarely dangerous, most people can learn to manage this condition on their own. HOME CARE INSTRUCTIONS Watch your back pain for any changes. The following actions may help to lessen any discomfort you are feeling:  Remain active. It is stressful on your back to sit or stand in one place for long periods of time. Do not sit, drive, or stand in one place for more than 30 minutes at a time. Take short walks on even surfaces as soon as you are able.Try to increase the length of time you walk each day.  Exercise regularly as directed by your health care provider. Exercise helps your back heal faster. It also helps avoid future injury by keeping your muscles strong and flexible.  Do not stay in bed.Resting more than 1-2 days can delay your recovery.  Pay attention to your body when you bend and lift. The most comfortable positions are those that put less stress on your recovering back. Always use proper lifting techniques, including:  Bending your knees.  Keeping the load close to your body.  Avoiding twisting.  Find a comfortable position to sleep. Use a firm mattress and lie on your side with your knees slightly bent. If you lie on your back, put a pillow under your knees.  Avoid feeling anxious or stressed.Stress increases muscle tension and can worsen back pain.It is important to recognize when you are anxious or stressed and learn ways to manage it, such as with  exercise.  Take medicines only as directed by your health care provider. Over-the-counter medicines to reduce pain and inflammation are often the most helpful.Your health care provider may prescribe muscle relaxant drugs.These medicines help dull your pain so you can more quickly return to your normal activities and healthy exercise.  Apply ice to the injured area:  Put ice in a plastic bag.  Place a towel between your skin and the bag.  Leave the ice on for 20 minutes, 2-3 times a day for the first 2-3 days. After that, ice and heat may be alternated to reduce pain and spasms.  Maintain a healthy weight. Excess weight puts extra stress on your back and makes it difficult to maintain good posture. SEEK MEDICAL CARE IF:  You have pain that is not relieved with rest or medicine.  You have increasing pain going down into the legs or buttocks.  You have pain that does not improve in one week.  You have night pain.  You lose weight.  You have a fever or chills. SEEK IMMEDIATE MEDICAL CARE IF:   You develop new bowel or bladder control problems.  You have unusual weakness or numbness in your arms or legs.  You develop nausea or vomiting.  You develop abdominal pain.  You feel faint.   This information is not intended to replace advice given to you by your health care provider. Make sure you discuss any questions you have with  your health care provider.   Document Released: 10/15/2005 Document Revised: 11/05/2014 Document Reviewed: 02/16/2014 Elsevier Interactive Patient Education [employer].

## 2015-08-17 NOTE — ED Notes (Signed)
Pt c/o LT hip/low back pain x 5 days. Denies injury. Took Aleve and Advil with some relief.

## 2015-08-17 NOTE — ED Provider Notes (Signed)
CSN: [id number]     Arrival date & time 08/17/15  1210 History   First MD Initiated Contact with Patient 08/17/15 1307     Chief Complaint  Patient presents with  . Back Pain      HPI Comments: Patient reports that he awoke 5 days ago with vague left low back and hip pain.  The pain is generally constant and does not radiate.  He recalls that several days prior he had had a brief episode of a tingling paresthesia over his right inner thigh that resolved spontaneously.  He recalls no injury or recent change in activities.   He denies bowel or bladder dysfunction, and no saddle numbness.    Patient is a 54 y.o. male presenting with back pain. The history is provided by the patient.  Back Pain Location:  Lumbar spine Quality:  Aching and stabbing Radiates to:  Does not radiate Pain severity:  Mild Pain is:  Same all the time Onset quality:  Sudden Duration:  5 days Timing:  Constant Progression:  Unchanged Chronicity:  New Context: not recent injury   Relieved by:  Nothing Worsened by:  Movement Associated symptoms: paresthesias   Associated symptoms: no abdominal pain, no bladder incontinence, no bowel incontinence, no dysuria, no fever, no leg pain, no numbness, no pelvic pain, no perianal numbness, no tingling, no weakness and no weight loss     Past Medical History  Diagnosis Date  . Chronic elbow pain left  . Arthritis    Past Surgical History  Procedure Laterality Date  . Hip arthroscopy w/ labral debridement  03-09-2011    RIGHT HIP  . Right shoulder surgery  2000  . Exercise treadmill stress test  04-19-2008    LOW RISK/ NO ISCHEMIA  . Repair extensor tendon  08/15/2012    Procedure: REPAIR EXTENSOR TENDON;  Surgeon: Magnus Sinning, MD;  Location: [facility];  Service: Orthopedics;  Laterality: Left;  WITH PARTIAL LATERAL EPICONDYLECTOMY OF THE LEFT ELBOW    Family History  Problem Relation Age of Onset  . Colon cancer Neg Hx   . COPD Mother     . Heart attack Father    Social History  Substance Use Topics  . Smoking status: Never Smoker   . Smokeless tobacco: Current User    Types: Snuff  . Alcohol Use: 1.8 oz/week    3 Cans of beer per week    Review of Systems  Constitutional: Negative for fever, chills, weight loss, diaphoresis, activity change, fatigue and unexpected weight change.  Gastrointestinal: Negative for abdominal pain and bowel incontinence.  Genitourinary: Negative for bladder incontinence, dysuria and pelvic pain.  Musculoskeletal: Positive for back pain.  Neurological: Positive for paresthesias. Negative for tingling, weakness and numbness.  All other systems reviewed and are negative.   Allergies  Review of patient's allergies indicates no known allergies.  Home Medications   Prior to Admission medications   Medication Sig Start Date End Date Taking? Authorizing Provider  aspirin 81 MG tablet Take 81 mg by mouth daily.    Historical Provider, MD  fish oil-omega-3 fatty acids 1000 MG capsule Take 2 g by mouth 2 (two) times daily.     Historical Provider, MD  glucosamine-chondroitin 500-400 MG tablet Take 2 tablets by mouth 2 (two) times daily.    Historical Provider, MD  naproxen sodium (ANAPROX) 220 MG tablet Take 220 mg by mouth as needed.    Historical Provider, MD  predniSONE (DELTASONE) 20 MG tablet  Take 1 tablet (20 mg total) by mouth 2 (two) times daily. Take with food. 08/17/15   Kandra Nicolas, MD   Meds Ordered and Administered this Visit  Medications - No data to display  BP 123/80 mmHg  Pulse 82  Temp(Src) 98.2 F (36.8 C) (Oral)  Resp 16  Ht 5\' 8"  (1.727 m)  Wt 170 lb (77.111 kg)  BMI 25.85 kg/m2  SpO2 99% No data found.   Physical Exam  Constitutional: He is oriented to person, place, and time. He appears well-developed and well-nourished. No distress.  HENT:  Head: Normocephalic.  Eyes: Pupils are equal, round, and reactive to light.  Neck: Normal range of motion.   Cardiovascular: Normal heart sounds.   Pulmonary/Chest: Breath sounds normal.  Abdominal: There is no tenderness.  Musculoskeletal: He exhibits no edema.       Lumbar back: He exhibits pain. He exhibits normal range of motion, no tenderness, no bony tenderness and no swelling.       Back:  Patient experiences pain in the outlined area over his left lumbo-sacral area, but has no tenderness to palpation there.  Range of motion relatively well preserved.  Can heel/toe walk and squat without difficulty. Straight leg raising test is negative.  Sitting knee extension test is negative.  Strength and sensation in the lower extremities is normal.  Patellar and achilles reflexes are normal   Neurological: He is alert and oriented to person, place, and time.  Skin: Skin is warm and dry. No rash noted.  Nursing note and vitals reviewed.   ED Course  Procedures  None   Imaging Review Dg Lumbar Spine Complete  08/17/2015  CLINICAL DATA:  Left lower back pain for 5 days EXAM: LUMBAR SPINE - COMPLETE 4+ VIEW COMPARISON:  None. FINDINGS: There are 5 nonrib bearing lumbar-type vertebral bodies. Mild anterior vertebral body height loss of the L2 vertebral body. The remainder the vertebral body heights are maintained. There is no spondylolysis. There is 3 mm of retrolisthesis of L2 on L3. There is mild degenerative disc disease at L1-2, L2-3 and L5-S1. There is bilateral facet arthropathy at L4-5 and L5-S1. The SI joints are unremarkable. IMPRESSION: 1. Age indeterminate mild anterior vertebral body compression deformity of the L2 vertebral body. Electronically Signed   By: Kathreen Devoid   On: 08/17/2015 13:42      MDM   1. Left-sided low back pain without sciatica    Begin prednisone burst. Apply ice pack for 20 to 30 minutes, 3 to 4 times daily  Continue until pain decreases.  Followup with Dr. Aundria Mems or Dr. Lynne Leader ([facility]) if not improving about two weeks.      Kandra Nicolas, MD 08/18/15 [phone]

## 2015-10-19 ENCOUNTER — Encounter: Payer: Self-pay | Admitting: Family Medicine

## 2015-10-19 ENCOUNTER — Ambulatory Visit (INDEPENDENT_AMBULATORY_CARE_PROVIDER_SITE_OTHER): Payer: 59 | Admitting: Family Medicine

## 2015-10-19 VITALS — BP 135/78 | HR 79 | Ht 68.0 in | Wt 163.0 lb

## 2015-10-19 DIAGNOSIS — Z23 Encounter for immunization: Secondary | ICD-10-CM | POA: Diagnosis not present

## 2015-10-19 DIAGNOSIS — Z0000 Encounter for general adult medical examination without abnormal findings: Secondary | ICD-10-CM

## 2015-10-19 NOTE — Addendum Note (Signed)
Addended by: Delrae Alfred on: 10/19/2015 09:47 AM   Modules accepted: Orders

## 2015-10-19 NOTE — Patient Instructions (Signed)
Dr. Stetson Pelaez's General Advice Following Your Complete Physical Exam  The Benefits of Regular Exercise: Unless you suffer from an uncontrolled cardiovascular condition, studies strongly suggest that regular exercise and physical activity will add to both the quality and length of your life.  The World Health Organization recommends 150 minutes of moderate intensity aerobic activity every week.  This is best split over 3-4 days a week, and can be as simple as a brisk walk for just over 35 minutes "most days of the week".  This type of exercise has been shown to lower LDL-Cholesterol, lower average blood sugars, lower blood pressure, lower cardiovascular disease risk, improve memory, and increase one's overall sense of wellbeing.  The addition of anaerobic (or "strength training") exercises offers additional benefits including but not limited to increased metabolism, prevention of osteoporosis, and improved overall cholesterol levels.  How Can I Strive For A Low-Fat Diet?: Current guidelines recommend that 25-35 percent of your daily energy (food) intake should come from fats.  One might ask how can this be achieved without having to dissect each meal on a daily basis?  Switch to skim or 1% milk instead of whole milk.  Focus on lean meats such as ground turkey, fresh fish, baked chicken, and lean cuts of beef as your source of dietary protein.  Limit saturated fat consumption to less than 10% of your daily caloric intake.  Limit trans fatty acid consumption primarily by limiting synthetic trans fats such as partially hydrogenated oils (Ex: fried fast foods).  Substitute olive or vegetable oil for solid fats where possible.  Moderation of Salt Intake: Provided you don't carry a diagnosis of congestive heart failure nor renal failure, I recommend a daily allowance of no more than 2300 mg of salt (sodium).  Keeping under this daily goal is associated with a decreased risk of cardiovascular events, creeping  above it can lead to elevated blood pressures and increases your risk of cardiovascular events.  Milligrams (mg) of salt is listed on all nutrition labels, and your daily intake can add up faster than you think.  Most canned and frozen dinners can pack in over half your daily salt allowance in one meal.    Lifestyle Health Risks: Certain lifestyle choices carry specific health risks.  As you may already know, tobacco use has been associated with increasing one's risk of cardiovascular disease, pulmonary disease, numerous cancers, among many other issues.  What you may not know is that there are medications and nicotine replacement strategies that can more than double your chances of successfully quitting.  I would be thrilled to help manage your quitting strategy if you currently use tobacco products.  When it comes to alcohol use, I've yet to find an "ideal" daily allowance.  Provided an individual does not have a medical condition that is exacerbated by alcohol consumption, general guidelines determine "safe drinking" as no more than two standard drinks for a man or no more than one standard drink for a male per day.  However, much debate still exists on whether any amount of alcohol consumption is technically "safe".  My general advice, keep alcohol consumption to a minimum for general health promotion.  If you or others believe that alcohol, tobacco, or recreational drug use is interfering with your life, I would be happy to provide confidential counseling regarding treatment options.  General "Over The Counter" Nutrition Advice: Postmenopausal women should aim for a daily calcium intake of 1200 mg, however a significant portion of this might already be   provided by diets including milk, yogurt, cheese, and other dairy products.  Vitamin D has been shown to help preserve bone density, prevent fatigue, and has even been shown to help reduce falls in the elderly.  Ensuring a daily intake of 800 Units of  Vitamin D is a good place to start to enjoy the above benefits, we can easily check your Vitamin D level to see if you'd potentially benefit from supplementation beyond 800 Units a day.  Folic Acid intake should be of particular concern to women of childbearing age.  Daily consumption of 400-800 mcg of Folic Acid is recommended to minimize the chance of spinal cord defects in a fetus should pregnancy occur.    For many adults, accidents still remain one of the most common culprits when it comes to cause of death.  Some of the simplest but most effective preventitive habits you can adopt include regular seatbelt use, proper helmet use, securing firearms, and regularly testing your smoke and carbon monoxide detectors.  Fawzi Melman B. Sua Spadafora DO [facility] [address], [address] [place], [place] [address] Phone: [phone]  

## 2015-10-19 NOTE — Progress Notes (Signed)
CC: Douglas Shah is a 54 y.o. male is here for Establish Care   Subjective: HPI:  Colonoscopy: Up-to-date Prostate: Discussed screening risks/beneifts with patient today, he is due for a PSA.  Influenza Vaccine: Declined today Pneumovax: No current indication Td/Tdap: Overdue will receive Tdap today Zoster: He actually has already had this when he was 54 years old  Very pleasant 54 year old here to establish care, husband of Armen Pickup  No acute complaints.  Review of Systems - General ROS: negative for - chills, fever, night sweats, weight gain or weight loss Ophthalmic ROS: negative for - decreased vision Psychological ROS: negative for - anxiety or depression ENT ROS: negative for - hearing change, nasal congestion, tinnitus or allergies Hematological and Lymphatic ROS: negative for - bleeding problems, bruising or swollen lymph nodes Breast ROS: negative Respiratory ROS: no cough, shortness of breath, or wheezing Cardiovascular ROS: no chest pain or dyspnea on exertion Gastrointestinal ROS: no abdominal pain, change in bowel habits, or black or bloody stools Genito-Urinary ROS: negative for - genital discharge, genital ulcers, incontinence or abnormal bleeding from genitals Musculoskeletal ROS: negative for - joint pain or muscle pain Neurological ROS: negative for - headaches or memory loss Dermatological ROS: negative for lumps, mole changes, rash and skin lesion changes  Past Medical History  Diagnosis Date  . Chronic elbow pain left  . Arthritis     Past Surgical History  Procedure Laterality Date  . Hip arthroscopy w/ labral debridement  03-09-2011    RIGHT HIP  . Right shoulder surgery  2000  . Exercise treadmill stress test  04-19-2008    LOW RISK/ NO ISCHEMIA  . Repair extensor tendon  08/15/2012    Procedure: REPAIR EXTENSOR TENDON;  Surgeon: Magnus Sinning, MD;  Location: [facility];  Service: Orthopedics;  Laterality: Left;  WITH PARTIAL  LATERAL EPICONDYLECTOMY OF THE LEFT ELBOW    Family History  Problem Relation Age of Onset  . Colon cancer Neg Hx   . COPD Mother   . Heart attack Father     Social History   Social History  . Marital Status: Married    Spouse Name: N/A  . Number of Children: N/A  . Years of Education: N/A   Occupational History  . Not on file.   Social History Main Topics  . Smoking status: Never Smoker   . Smokeless tobacco: Current User    Types: Snuff  . Alcohol Use: 1.8 oz/week    3 Cans of beer per week  . Drug Use: No  . Sexual Activity: Not on file   Other Topics Concern  . Not on file   Social History Narrative     Objective: BP 135/78 mmHg  Pulse 79  Ht 5\' 8"  (1.727 m)  Wt 163 lb (73.936 kg)  BMI 24.79 kg/m2  General: No Acute Distress HEENT: Atraumatic, normocephalic, conjunctivae normal without scleral icterus.  No nasal discharge, hearing grossly intact, TMs with good landmarks bilaterally with no middle ear abnormalities, posterior pharynx clear without oral lesions. Neck: Supple, trachea midline, no cervical nor supraclavicular adenopathy. Pulmonary: Clear to auscultation bilaterally without wheezing, rhonchi, nor rales. Cardiac: Regular rate and rhythm.  No murmurs, rubs, nor gallops. No peripheral edema.  2+ peripheral pulses bilaterally. Abdomen: Bowel sounds normal.  No masses.  Non-tender without rebound.  Negative Murphy's sign. MSK: Grossly intact, no signs of weakness.  Full strength throughout upper and lower extremities.  Full ROM in upper and lower extremities.  No midline spinal tenderness. Neuro: Gait unremarkable, CN II-XII grossly intact.  C5-C6 Reflex 2/4 Bilaterally, L4 Reflex 2/4 Bilaterally.  Cerebellar function intact. Skin: No rashes. Psych: Alert and oriented to person/place/time.  Thought process normal. No anxiety/depression.  Assessment & Plan: [name] was seen today for establish care.  Diagnoses and all orders for this visit:  Annual  physical exam -     Lipid panel -     COMPLETE METABOLIC PANEL WITH GFR -     CBC -     PSA   Healthy lifestyle interventions including but not limited to regular exercise, a healthy low fat diet, moderation of salt intake, the dangers of tobacco/alcohol/recreational drug use, nutrition supplementation, and accident avoidance were discussed with the patient and a handout was provided for future reference.  Return for tomorrow's lab only visit, fasting. Marland Kitchen

## 2015-10-20 ENCOUNTER — Encounter: Payer: 59 | Admitting: Family Medicine

## 2015-11-11 LAB — CBC
HEMATOCRIT: 42.7 % (ref 39.0–52.0)
HEMOGLOBIN: 14.5 g/dL (ref 13.0–17.0)
MCH: 30.5 pg (ref 26.0–34.0)
MCHC: 34 g/dL (ref 30.0–36.0)
MCV: 89.7 fL (ref 78.0–100.0)
MPV: 10.4 fL (ref 8.6–12.4)
Platelets: 220 10*3/uL (ref 150–400)
RBC: 4.76 MIL/uL (ref 4.22–5.81)
RDW: 13.1 % (ref 11.5–15.5)
WBC: 4.5 10*3/uL (ref 4.0–10.5)

## 2015-11-12 LAB — COMPLETE METABOLIC PANEL WITH GFR
ALT: 27 U/L (ref 9–46)
AST: 19 U/L (ref 10–35)
Albumin: 4.7 g/dL (ref 3.6–5.1)
Alkaline Phosphatase: 67 U/L (ref 40–115)
BUN: 9 mg/dL (ref 7–25)
CALCIUM: 9.4 mg/dL (ref 8.6–10.3)
CHLORIDE: 98 mmol/L (ref 98–110)
CO2: 25 mmol/L (ref 20–31)
CREATININE: 0.94 mg/dL (ref 0.70–1.33)
GFR, Est African American: >89 mL/min (ref >=60)
GFR, Est Non African American: >89 mL/min (ref >=60)
Glucose, Bld: 87 mg/dL (ref 65–99)
POTASSIUM: 4.4 mmol/L (ref 3.5–5.3)
SODIUM: 133 mmol/L — AB (ref 135–146)
Total Bilirubin: 0.8 mg/dL (ref 0.2–1.2)
Total Protein: 6.6 g/dL (ref 6.1–8.1)

## 2015-11-12 LAB — LIPID PANEL
CHOL/HDL RATIO: 3.1 ratio (ref <=5.0)
Cholesterol: 210 mg/dL — ABNORMAL HIGH (ref 125–200)
HDL: 67 mg/dL (ref >=40)
LDL CALC: 123 mg/dL (ref <130)
Triglycerides: 100 mg/dL (ref <150)
VLDL: 20 mg/dL (ref <30)

## 2015-11-12 LAB — PSA: PSA: 0.58 ng/mL (ref <=4.00)

## 2015-11-15 ENCOUNTER — Encounter: Payer: Self-pay | Admitting: Family Medicine

## 2015-11-25 ENCOUNTER — Encounter: Payer: Self-pay | Admitting: Sports Medicine

## 2015-11-25 ENCOUNTER — Ambulatory Visit (INDEPENDENT_AMBULATORY_CARE_PROVIDER_SITE_OTHER): Payer: 59 | Admitting: Sports Medicine

## 2015-11-25 VITALS — BP 127/87 | HR 84 | Temp 98.0°F | Resp 18 | Wt 167.2 lb

## 2015-11-25 DIAGNOSIS — J0100 Acute maxillary sinusitis, unspecified: Secondary | ICD-10-CM | POA: Insufficient documentation

## 2015-11-25 MED ORDER — AMOXICILLIN-POT CLAVULANATE 875-125 MG PO TABS: 1.0000 | ORAL_TABLET | Freq: Two times a day (BID) | ORAL | Status: DC

## 2015-11-25 MED ORDER — FLUTICASONE PROPIONATE 50 MCG/ACT NA SUSP: NASAL | Status: DC

## 2015-11-25 NOTE — Patient Instructions (Signed)

## 2015-11-25 NOTE — Progress Notes (Signed)
  Subjective:    CC:  Right earache  HPI: This is a pleasant 55 year old male, for the past couple days he's had upper rest for symptoms, now he has pain on the right side of his face with radiation to the ear, moderate, persistent without radiation, no cough, shortness of breath, chest pain, no constitutional symptoms.  Past medical history, Surgical history, Family history not pertinant except as noted below, Social history, Allergies, and medications have been entered into the medical record, reviewed, and no changes needed.   Review of Systems: No fevers, chills, night sweats, weight loss, chest pain, or shortness of breath.   Objective:    General: Well Developed, well nourished, and in no acute distress.  Neuro: Alert and oriented x3, extra-ocular muscles intact, sensation grossly intact.  HEENT: Normocephalic, atraumatic, pupils equal round reactive to light, neck supple, no masses, no lymphadenopathy, thyroid nonpalpable.  Oropharynx, nasopharynx, ear canals unremarkable. Skin: Warm and dry, no rashes. Cardiac: Regular rate and rhythm, no murmurs rubs or gallops, no lower extremity edema.  Respiratory: Clear to auscultation bilaterally. Not using accessory muscles, speaking in full sentences.  Impression and Recommendations:

## 2015-11-25 NOTE — Assessment & Plan Note (Signed)
Right sided with radiation to the ear. Augmentin, Flonase, return to see PCP if no better in 2 weeks.

## 2015-12-09 ENCOUNTER — Ambulatory Visit: Payer: 59 | Admitting: Family Medicine

## 2016-02-01 ENCOUNTER — Emergency Department (INDEPENDENT_AMBULATORY_CARE_PROVIDER_SITE_OTHER)
Admission: EM | Admit: 2016-02-01 | Discharge: 2016-02-01 | Disposition: A | Discharge status: Home / Self Care | Payer: 59 | Source: Home / Self Care | Attending: Family Medicine | Admitting: Family Medicine

## 2016-02-01 ENCOUNTER — Encounter: Payer: Self-pay | Admitting: *Deleted

## 2016-02-01 DIAGNOSIS — J069 Acute upper respiratory infection, unspecified: Secondary | ICD-10-CM | POA: Diagnosis not present

## 2016-02-01 DIAGNOSIS — B9789 Other viral agents as the cause of diseases classified elsewhere: Principal | ICD-10-CM

## 2016-02-01 MED ORDER — GUAIFENESIN-CODEINE 100-10 MG/5ML PO SOLN: ORAL | Status: DC

## 2016-02-01 MED ORDER — AZITHROMYCIN 250 MG PO TABS: ORAL_TABLET | ORAL | Status: DC

## 2016-02-01 NOTE — Discharge Instructions (Signed)
Take plain guaifenesin (1200mg  extended release tabs such as Mucinex) twice daily, with plenty of water, for cough and congestion.  May add Pseudoephedrine (30mg , one or two every 4 to 6 hours) for sinus congestion.  Get adequate rest.   May use Afrin nasal spray (or generic oxymetazoline) twice daily for about 5 days and then discontinue.  Also recommend using saline nasal spray several times daily and saline nasal irrigation (AYR is a common brand).  Use Flonase nasal spray each morning after using Afrin nasal spray and saline nasal irrigation. Try warm salt water gargles for sore throat.  Stop all antihistamines for now, and other non-prescription cough/cold preparations. May take Ibuprofen 200mg , 4 tabs every 8 hours with food for body aches, headache, etc. Begin Azithromycin if not improving about 5 days or if persistent fever develops   Follow-up with family doctor if not improving about 7 to10 days.

## 2016-02-01 NOTE — ED Notes (Signed)
Pt c/o productive cough x 4 days. Denies fever. Reports episodes of hot and chills.

## 2016-02-01 NOTE — ED Provider Notes (Signed)
CSN: [id number]     Arrival date & time 02/01/16  0805 History   First MD Initiated Contact with Patient 02/01/16 [phone]     Chief Complaint  Patient presents with  . Cough      HPI Comments: Patient complains of five day history of typical cold-like symptoms developing over several days,  including mild sore throat, sinus congestion, headache, fatigue, chills, and cough.  His cough is partly productive and worse at night.  He has not had chills/sweats since yesterday.  The history is provided by the patient.    Past Medical History  Diagnosis Date  . Chronic elbow pain left  . Arthritis    Past Surgical History  Procedure Laterality Date  . Hip arthroscopy w/ labral debridement  03-09-2011    RIGHT HIP  . Right shoulder surgery  2000  . Exercise treadmill stress test  04-19-2008    LOW RISK/ NO ISCHEMIA  . Repair extensor tendon  08/15/2012    Procedure: REPAIR EXTENSOR TENDON;  Surgeon: Magnus Sinning, MD;  Location: [facility];  Service: Orthopedics;  Laterality: Left;  WITH PARTIAL LATERAL EPICONDYLECTOMY OF THE LEFT ELBOW    Family History  Problem Relation Age of Onset  . Colon cancer Neg Hx   . COPD Mother   . Heart attack Father    Social History  Substance Use Topics  . Smoking status: Never Smoker   . Smokeless tobacco: Current User    Types: Snuff  . Alcohol Use: 1.8 oz/week    3 Cans of beer per week    Review of Systems + sore throat + cough No pleuritic pain No wheezing + nasal congestion + post-nasal drainage No sinus pain/pressure No itchy/red eyes No earache No hemoptysis No SOB No fever, + chills/sweats No nausea No vomiting No abdominal pain No diarrhea No urinary symptoms No skin rash + fatigue + myalgias + headache Used OTC meds without relief  Allergies  Review of patient's allergies indicates no known allergies.  Home Medications   Prior to Admission medications   Medication Sig Start Date End Date Taking?  Authorizing Provider  azithromycin (ZITHROMAX Z-PAK) 250 MG tablet Take 2 tabs today; then begin one tab once daily for 4 more days. (Rx void after 02/09/16) 02/01/16   Kandra Nicolas, MD  fluticasone [facility]) 50 MCG/ACT nasal spray One spray in each nostril twice a day, use left hand for right nostril, and right hand for left nostril. 11/25/15   Silverio Decamp, MD  guaiFENesin-codeine 100-10 MG/5ML syrup Take 17mL by mouth at bedtime as needed for cough 02/01/16   Kandra Nicolas, MD   Meds Ordered and Administered this Visit  Medications - No data to display  BP 128/91 mmHg  Pulse 105  Temp(Src) 97.7 F (36.5 C) (Oral)  Resp 18  Ht 5\' 8"  (1.727 m)  Wt 166 lb (75.297 kg)  BMI 25.25 kg/m2  SpO2 100% No data found.   Physical Exam Nursing notes and Vital Signs reviewed. Appearance:  Patient appears stated age, and in no acute distress Eyes:  Pupils are equal, round, and reactive to light and accomodation.  Extraocular movement is intact.  Conjunctivae are not inflamed  Ears:  Canals normal.  Tympanic membranes normal.  Nose:  Congested turbinates.  No sinus tenderness.   Pharynx:  Normal Neck:  Supple.   Mildly tender enlarged posterior/lateral nodes are palpated bilaterally  Lungs:  Clear to auscultation.  Breath sounds are equal.  Moving air well. Heart:  Regular rate and rhythm without murmurs, rubs, or gallops.  Rate 100 Abdomen:  Nontender without masses or hepatosplenomegaly.  Bowel sounds are present.  No CVA or flank tenderness.  Extremities:  No edema.  Skin:  No rash present.   ED Course  Procedures none   MDM   1. Viral URI with cough    There is no evidence of bacterial infection today.  Treat symptomatically for now  Rx for Robitussin AC for night time cough.  Take plain guaifenesin (1200mg  extended release tabs such as Mucinex) twice daily, with plenty of water, for cough and congestion.  May add Pseudoephedrine (30mg , one or two every 4 to 6 hours) for  sinus congestion.  Get adequate rest.   May use Afrin nasal spray (or generic oxymetazoline) twice daily for about 5 days and then discontinue.  Also recommend using saline nasal spray several times daily and saline nasal irrigation (AYR is a common brand).  Use Flonase nasal spray each morning after using Afrin nasal spray and saline nasal irrigation. Try warm salt water gargles for sore throat.  Stop all antihistamines for now, and other non-prescription cough/cold preparations. May take Ibuprofen 200mg , 4 tabs every 8 hours with food for body aches, headache, etc. Begin Azithromycin if not improving about 5 days or if persistent fever develops (Given a prescription to hold, with an expiration date)  Follow-up with family doctor if not improving about 7 to10 days.    Kandra Nicolas, MD 02/01/16 [phone]

## 2016-05-11 ENCOUNTER — Ambulatory Visit (INDEPENDENT_AMBULATORY_CARE_PROVIDER_SITE_OTHER): Payer: 59

## 2016-05-11 ENCOUNTER — Encounter: Payer: Self-pay | Admitting: Family Medicine

## 2016-05-11 ENCOUNTER — Ambulatory Visit (INDEPENDENT_AMBULATORY_CARE_PROVIDER_SITE_OTHER): Payer: 59 | Admitting: Family Medicine

## 2016-05-11 VITALS — BP 132/89 | HR 71 | Wt 168.0 lb

## 2016-05-11 DIAGNOSIS — M940 Chondrocostal junction syndrome [Tietze]: Secondary | ICD-10-CM | POA: Diagnosis not present

## 2016-05-11 DIAGNOSIS — R0789 Other chest pain: Secondary | ICD-10-CM

## 2016-05-11 DIAGNOSIS — R072 Precordial pain: Secondary | ICD-10-CM | POA: Diagnosis not present

## 2016-05-11 MED ORDER — DICLOFENAC SODIUM 1 % TD GEL: 2.0000 g | Freq: Four times a day (QID) | TRANSDERMAL | Status: DC

## 2016-05-11 NOTE — Progress Notes (Signed)
Quick Note:  Xray shows no fracture ______ 

## 2016-05-11 NOTE — Patient Instructions (Signed)
Thank you for coming in today. Get xray today.  Take 1-2 aleve twice daily as needed for pain.  USe topical voltaren gel for pain as needed.  Return in a few weeks if not better.  Return sooner if worse.  Call or go to the emergency room if you get worse, have trouble breathing, have chest pains, or palpitations.   Costochondritis Costochondritis, sometimes called Tietze syndrome, is a swelling and irritation (inflammation) of the tissue (cartilage) that connects your ribs with your breastbone (sternum). It causes pain in the chest and rib area. Costochondritis usually goes away on its own over time. It can take up to 6 weeks or longer to get better, especially if you are unable to limit your activities. CAUSES  Some cases of costochondritis have no known cause. Possible causes include:  Injury (trauma).  Exercise or activity such as lifting.  Severe coughing. SIGNS AND SYMPTOMS  Pain and tenderness in the chest and rib area.  Pain that gets worse when coughing or taking deep breaths.  Pain that gets worse with specific movements. DIAGNOSIS  Your health care provider will do a physical exam and ask about your symptoms. Chest X-rays or other tests may be done to rule out other problems. TREATMENT  Costochondritis usually goes away on its own over time. Your health care provider may prescribe medicine to help relieve pain. HOME CARE INSTRUCTIONS   Avoid exhausting physical activity. Try not to strain your ribs during normal activity. This would include any activities using chest, abdominal, and side muscles, especially if heavy weights are used.  Apply ice to the affected area for the first 2 days after the pain begins.  Put ice in a plastic bag.  Place a towel between your skin and the bag.  Leave the ice on for 20 minutes, 2-3 times a day.  Only take over-the-counter or prescription medicines as directed by your health care provider. SEEK MEDICAL CARE IF:  You have redness  or swelling at the rib joints. These are signs of infection.  Your pain does not go away despite rest or medicine. SEEK IMMEDIATE MEDICAL CARE IF:   Your pain increases or you are very uncomfortable.  You have shortness of breath or difficulty breathing.  You cough up blood.  You have worse chest pains, sweating, or vomiting.  You have a fever or persistent symptoms for more than 2-3 days.  You have a fever and your symptoms suddenly get worse. MAKE SURE YOU:   Understand these instructions.  Will watch your condition.  Will get help right away if you are not doing well or get worse.   This information is not intended to replace advice given to you by your health care provider. Make sure you discuss any questions you have with your health care provider.   Document Released: 07/25/2005 Document Revised: 08/05/2013 Document Reviewed: 05/19/2013 Elsevier Interactive Patient Education [employer].

## 2016-05-11 NOTE — Progress Notes (Signed)
   Subjective:    I'm seeing this patient as a consultation for:  Dr. Ileene Rubens  CC: Left chest wall pain  HPI: Patient has a one-week history of left chest wall pain. He notes the pain occurred after he wrestled with his adult son. He denies however specific pop or immediate pain during or following wrestling. He notes the pain in his left anterior located at the sternal border and worse with arm motion. He denies any radiating pain weakness or numbness fevers or chills. He denies any exertional pain shortness of breath palpitations. The pain does not seem to change at all with food.  Past medical history, Surgical history, Family history not pertinant except as noted below, Social history, Allergies, and medications have been entered into the medical record, reviewed, and no changes needed.   Review of Systems: No headache, visual changes, nausea, vomiting, diarrhea, constipation, dizziness, abdominal pain, skin rash, fevers, chills, night sweats, weight loss, swollen lymph nodes, body aches, joint swelling, muscle aches, chest pain, shortness of breath, mood changes, visual or auditory hallucinations.   Objective:    Filed Vitals:   05/11/16 1003  BP: 132/89  Pulse: 71   General: Well Developed, well nourished, and in no acute distress.  Neuro/Psych: Alert and oriented x3, extra-ocular muscles intact, able to move all 4 extremities, sensation grossly intact. Skin: Warm and dry, no rashes noted.  Respiratory: Not using accessory muscles, speaking in full sentences, trachea midline. Normal work of breathing. Clear to auscultation bilaterally Cardiovascular: Pulses palpable, no extremity edema. Regular rate and rhythm no MRG Abdomen: Does not appear distended. MSK: Tender to palpation left sternal border. Pain is exacerbated with activation of the pectoralis muscle group. Musculoskeletal exam is otherwise unremarkable.   12-lead EKG shows normal sinus rhythm at 71 bpm. No ST segment  elevation or depression. Normal EKG.  No results found for this or any previous visit (from the past 24 hour(s)). No results found.  Impression and Recommendations:    Assessment and Plan: 55 y.o. male with Costochondritis.. Very doubtful for serous etiology.  Plan for sternal x-ray and chest x-ray. Treat empirically with naproxen and topical diclofenac.  Return in a few weeks if not better at that time would proceed with further cardiac workup and also CT versus MRI of chest wall.  CC: Marcial Pacas, DO

## 2016-05-11 NOTE — Addendum Note (Signed)
Addended by: Darla Lesches T on: 05/11/2016 12:07 PM   Modules accepted: Orders

## 2016-05-17 ENCOUNTER — Emergency Department
Admission: EM | Admit: 2016-05-17 | Discharge: 2016-05-17 | Disposition: A | Discharge status: Home / Self Care | Payer: 59 | Source: Home / Self Care | Attending: Family Medicine | Admitting: Family Medicine

## 2016-05-17 ENCOUNTER — Encounter: Payer: Self-pay | Admitting: Emergency Medicine

## 2016-05-17 DIAGNOSIS — S90862A Insect bite (nonvenomous), left foot, initial encounter: Secondary | ICD-10-CM | POA: Diagnosis not present

## 2016-05-17 DIAGNOSIS — L089 Local infection of the skin and subcutaneous tissue, unspecified: Secondary | ICD-10-CM

## 2016-05-17 DIAGNOSIS — W57XXXA Bitten or stung by nonvenomous insect and other nonvenomous arthropods, initial encounter: Principal | ICD-10-CM

## 2016-05-17 MED ORDER — DOXYCYCLINE HYCLATE 100 MG PO CAPS: 100.0000 mg | ORAL_CAPSULE | Freq: Two times a day (BID) | ORAL | Status: DC

## 2016-05-17 NOTE — ED Notes (Signed)
Pt states he removed a tick between his big toe on 05/05/16...small blister like area not improving since then.

## 2016-05-17 NOTE — ED Provider Notes (Signed)
CSN: [id number]     Arrival date & time 05/17/16  1618 History   First MD Initiated Contact with Patient 05/17/16 1656     Chief Complaint  Patient presents with  . Insect Bite   (Consider location/radiation/quality/duration/timing/severity/associated sxs/prior Treatment) HPI  Douglas Shah is a 55 y.o. male presenting to UC with c/o increased pain, redness, and mild swelling with blisters between Left great toe and second toe after removing a tick about 1 week ago. He initially thought it was athlete's foot so he was using OTC Lotrimin cream and spray but no relief. Mild itching and irritation. Denies fever, chills, n/v/d, myalgias or arthralgias.     Past Medical History  Diagnosis Date  . Chronic elbow pain left  . Arthritis    Past Surgical History  Procedure Laterality Date  . Hip arthroscopy w/ labral debridement  03-09-2011    RIGHT HIP  . Right shoulder surgery  2000  . Exercise treadmill stress test  04-19-2008    LOW RISK/ NO ISCHEMIA  . Repair extensor tendon  08/15/2012    Procedure: REPAIR EXTENSOR TENDON;  Surgeon: Magnus Sinning, MD;  Location: [facility];  Service: Orthopedics;  Laterality: Left;  WITH PARTIAL LATERAL EPICONDYLECTOMY OF THE LEFT ELBOW    Family History  Problem Relation Age of Onset  . Colon cancer Neg Hx   . COPD Mother   . Heart attack Father    Social History  Substance Use Topics  . Smoking status: Never Smoker   . Smokeless tobacco: Current User    Types: Snuff  . Alcohol Use: 1.8 oz/week    3 Cans of beer per week    Review of Systems  Constitutional: Negative for fever and chills.  Gastrointestinal: Negative for nausea and vomiting.  Musculoskeletal: Negative for myalgias, joint swelling and arthralgias.  Skin: Positive for color change and wound. Negative for rash.  Neurological: Negative for weakness and numbness.    Allergies  Review of patient's allergies indicates no known allergies.  Home  Medications   Prior to Admission medications   Medication Sig Start Date End Date Taking? Authorizing Provider  diclofenac sodium (VOLTAREN) 1 % GEL Apply 2 g topically 4 (four) times daily. To affected joint. 05/11/16   Gregor Hams, MD  doxycycline (VIBRAMYCIN) 100 MG capsule Take 1 capsule (100 mg total) by mouth 2 (two) times daily. One po bid x 7 days 05/17/16   Noland Fordyce, PA-C  fluticasone [facility]) 50 MCG/ACT nasal spray One spray in each nostril twice a day, use left hand for right nostril, and right hand for left nostril. 11/25/15   Silverio Decamp, MD   Meds Ordered and Administered this Visit  Medications - No data to display  BP 132/86 mmHg  Pulse 69  Temp(Src) 98 F (36.7 C) (Oral)  Wt 168 lb (76.204 kg)  SpO2 100% No data found.   Physical Exam  Constitutional: He is oriented to person, place, and time. He appears well-developed and well-nourished.  HENT:  Head: Normocephalic and atraumatic.  Eyes: EOM are normal.  Neck: Normal range of motion.  Cardiovascular: Normal rate.   Pulmonary/Chest: Effort normal.  Musculoskeletal: Normal range of motion. He exhibits no edema or tenderness.  Neurological: He is alert and oriented to person, place, and time.  Skin: Skin is warm and dry. There is erythema.  Left foot, web space between 1st and 2nd toe: mild erythema with 2-3 converging blisters, scant yellow oozing discharge. No  red streaking.  Psychiatric: He has a normal mood and affect. His behavior is normal.  Nursing note and vitals reviewed.   ED Course  Procedures (including critical care time)  Labs Review Labs Reviewed - No data to display  Imaging Review No results found.    MDM   1. Infected tick bite of foot, left, initial encounter    Pt c/o skin irritation at site of tick bite from 1 week ago. No systemic symptoms. Will place pt on doxycycline to treat for soft tissue skin infection while also covering for tick borne illness.  Encouraged  f/u with PCP in 1 week if not improving, sooner if worsening. Patient verbalized understanding and agreement with treatment plan.    Noland Fordyce, PA-C 05/17/16 1826

## 2016-06-11 ENCOUNTER — Encounter: Payer: Self-pay | Admitting: Family Medicine

## 2017-01-25 ENCOUNTER — Emergency Department
Admission: EM | Admit: 2017-01-25 | Discharge: 2017-01-25 | Disposition: A | Discharge status: Home / Self Care | Payer: 59 | Source: Home / Self Care | Attending: Family Medicine | Admitting: Family Medicine

## 2017-01-25 ENCOUNTER — Encounter: Payer: Self-pay | Admitting: Emergency Medicine

## 2017-01-25 DIAGNOSIS — J0100 Acute maxillary sinusitis, unspecified: Secondary | ICD-10-CM

## 2017-01-25 MED ORDER — PREDNISONE 20 MG PO TABS: ORAL_TABLET | ORAL | 0 refills | Status: DC

## 2017-01-25 MED ORDER — AMOXICILLIN 875 MG PO TABS: 875.0000 mg | ORAL_TABLET | Freq: Two times a day (BID) | ORAL | 0 refills | Status: DC

## 2017-01-25 NOTE — ED Triage Notes (Signed)
Pt c/o possible sinus infection, cough and cold x 2 weeks then green drainage, head feels clogged up x 3 days ago, no ear pain.

## 2017-01-25 NOTE — ED Provider Notes (Signed)
[facility]    CSN: [id number] Arrival date & time: 01/25/17  1424     History   Chief Complaint Chief Complaint  Patient presents with  . Recurrent Sinusitis    HPI Douglas Shah is a 56 y.o. male.   Patient had a cold about 2.5 weeks ago, and felt that he had recovered.  During the past two days he developed increased sinus congestion and sore throat.  No fevers, chills, and sweats.   The history is provided by the patient.    Past Medical History:  Diagnosis Date  . Arthritis   . Chronic elbow pain left    Patient Active Problem List   Diagnosis Date Noted  . Acute non-recurrent maxillary sinusitis 11/25/2015    Past Surgical History:  Procedure Laterality Date  . EXERCISE TREADMILL STRESS TEST  04-19-2008   LOW RISK/ NO ISCHEMIA  . HIP ARTHROSCOPY W/ LABRAL DEBRIDEMENT  03-09-2011   RIGHT HIP  . REPAIR EXTENSOR TENDON  08/15/2012   Procedure: REPAIR EXTENSOR TENDON;  Surgeon: Magnus Sinning, MD;  Location: [facility];  Service: Orthopedics;  Laterality: Left;  WITH PARTIAL LATERAL EPICONDYLECTOMY OF THE LEFT ELBOW   . RIGHT SHOULDER SURGERY  2000       Home Medications    Prior to Admission medications   Medication Sig Start Date End Date Taking? Authorizing Provider  amoxicillin (AMOXIL) 875 MG tablet Take 1 tablet (875 mg total) by mouth 2 (two) times daily. 01/25/17   Kandra Nicolas, MD  predniSONE (DELTASONE) 20 MG tablet Take one tab by mouth twice daily for 5 days, then one daily. Take with food. 01/25/17   Kandra Nicolas, MD    Family History Family History  Problem Relation Age of Onset  . COPD Mother   . Heart attack Father   . Colon cancer Neg Hx     Social History Social History  Substance Use Topics  . Smoking status: Never Smoker  . Smokeless tobacco: Current User    Types: Snuff  . Alcohol use 1.8 oz/week    3 Cans of beer per week     Allergies   Patient has no known  allergies.   Review of Systems Review of Systems + sore throat No cough No pleuritic pain No wheezing + nasal congestion + post-nasal drainage + sinus pain/pressure No itchy/red eyes No earache No hemoptysis No SOB No fever/chills No nausea No vomiting No abdominal pain No diarrhea No urinary symptoms No skin rash + fatigue No myalgias + headache Used OTC meds without relief   Physical Exam Triage Vital Signs ED Triage Vitals  Enc Vitals Group     BP 01/25/17 1440 (!) 144/84     Pulse Rate 01/25/17 1440 76     Resp --      Temp 01/25/17 1440 97.8 F (36.6 C)     Temp Source 01/25/17 1440 Oral     SpO2 01/25/17 1440 99 %     Weight 01/25/17 1440 162 lb 12 oz (73.8 kg)     Height 01/25/17 1440 5\' 8"  (1.727 m)     Head Circumference --      Peak Flow --      Pain Score 01/25/17 1441 0     Pain Loc --      Pain Edu? --      Excl. in [place]? --    No data found.   Updated Vital Signs BP (!) 144/84 (  BP Location: Left Arm)   Pulse 76   Temp 97.8 F (36.6 C) (Oral)   Ht 5\' 8"  (1.727 m)   Wt 162 lb 12 oz (73.8 kg)   SpO2 99%   BMI 24.75 kg/m   Visual Acuity Right Eye Distance:   Left Eye Distance:   Bilateral Distance:    Right Eye Near:   Left Eye Near:    Bilateral Near:     Physical Exam Nursing notes and Vital Signs reviewed. Appearance:  Patient appears stated age, and in no acute distress Eyes:  Pupils are equal, round, and reactive to light and accomodation.  Extraocular movement is intact.  Conjunctivae are not inflamed  Ears:  Canals normal.  Tympanic membranes normal.  Nose:  Mildly congested turbinates.  Maxillary sinus tenderness is present.  Pharynx:  Normal Neck:  Supple. No adenopathy. Lungs:  Clear to auscultation.  Breath sounds are equal.  Moving air well. Heart:  Regular rate and rhythm without murmurs, rubs, or gallops.  Abdomen:  Nontender without masses or hepatosplenomegaly.  Bowel sounds are present.  No CVA or flank  tenderness.  Extremities:  No edema.  Skin:  No rash present.    UC Treatments / Results  Labs (all labs ordered are listed, but only abnormal results are displayed) Labs Reviewed - No data to display  EKG  EKG Interpretation None       Radiology No results found.  Procedures Procedures (including critical care time)  Medications Ordered in UC Medications - No data to display   Initial Impression / Assessment and Plan / UC Course  I have reviewed the triage vital signs and the nursing notes.  Pertinent labs & imaging results that were available during my care of the patient were reviewed by me and considered in my medical decision making (see chart for details).    Begin amoxicillin and prednisone burst/taper. Continue Pseudoephedrine (30mg , one or two every 4 to 6 hours) for sinus congestion.  Get adequate rest.   May use Afrin nasal spray (or generic oxymetazoline) each morning for about 5 days and then discontinue.  Also recommend using saline nasal spray several times daily and saline nasal irrigation (AYR is a common brand).  Use Flonase nasal spray each morning after using Afrin nasal spray and saline nasal irrigation. Stop all antihistamines for now, and other non-prescription cough/cold preparations. Followup with ENT if not improving.   Final Clinical Impressions(s) / UC Diagnoses   Final diagnoses:  Acute non-recurrent maxillary sinusitis    New Prescriptions New Prescriptions   AMOXICILLIN (AMOXIL) 875 MG TABLET    Take 1 tablet (875 mg total) by mouth 2 (two) times daily.   PREDNISONE (DELTASONE) 20 MG TABLET    Take one tab by mouth twice daily for 5 days, then one daily. Take with food.     Kandra Nicolas, MD 02/04/17 [phone]

## 2017-01-25 NOTE — Discharge Instructions (Signed)
Continue Pseudoephedrine (30mg , one or two every 4 to 6 hours) for sinus congestion.  Get adequate rest.   May use Afrin nasal spray (or generic oxymetazoline) each morning for about 5 days and then discontinue.  Also recommend using saline nasal spray several times daily and saline nasal irrigation (AYR is a common brand).  Use Flonase nasal spray each morning after using Afrin nasal spray and saline nasal irrigation. Stop all antihistamines for now, and other non-prescription cough/cold preparations.

## 2017-07-15 ENCOUNTER — Ambulatory Visit (INDEPENDENT_AMBULATORY_CARE_PROVIDER_SITE_OTHER): Payer: 59 | Admitting: Family Medicine

## 2017-07-15 ENCOUNTER — Encounter: Payer: Self-pay | Admitting: Family Medicine

## 2017-07-15 VITALS — BP 135/68 | HR 71 | Ht 68.0 in | Wt 163.0 lb

## 2017-07-15 DIAGNOSIS — Z0000 Encounter for general adult medical examination without abnormal findings: Secondary | ICD-10-CM

## 2017-07-15 DIAGNOSIS — Z720 Tobacco use: Secondary | ICD-10-CM

## 2017-07-15 DIAGNOSIS — Z114 Encounter for screening for human immunodeficiency virus [HIV]: Secondary | ICD-10-CM

## 2017-07-15 DIAGNOSIS — Z789 Other specified health status: Secondary | ICD-10-CM

## 2017-07-15 DIAGNOSIS — Z1159 Encounter for screening for other viral diseases: Secondary | ICD-10-CM

## 2017-07-15 DIAGNOSIS — Z125 Encounter for screening for malignant neoplasm of prostate: Secondary | ICD-10-CM

## 2017-07-15 DIAGNOSIS — L570 Actinic keratosis: Secondary | ICD-10-CM | POA: Diagnosis not present

## 2017-07-15 NOTE — Progress Notes (Signed)
Douglas Shah is a 56 y.o. male who presents to [facility] today for well adult visit. Douglas Shah does well overall. He exercises regularly and tries to eat a balanced diet. He tries to stay out of the sun and wear sunscreen if possible. He uses snuff tobacco daily.  He is not interested in quitting his snuff.  He denies any suicidality or depressed mood.   Past Medical History:  Diagnosis Date  . Arthritis   . Chronic elbow pain left   Past Surgical History:  Procedure Laterality Date  . EXERCISE TREADMILL STRESS TEST  04-19-2008   LOW RISK/ NO ISCHEMIA  . HIP ARTHROSCOPY W/ LABRAL DEBRIDEMENT  03-09-2011   RIGHT HIP  . REPAIR EXTENSOR TENDON  08/15/2012   Procedure: REPAIR EXTENSOR TENDON;  Surgeon: Magnus Sinning, MD;  Location: [facility];  Service: Orthopedics;  Laterality: Left;  WITH PARTIAL LATERAL EPICONDYLECTOMY OF THE LEFT ELBOW   . RIGHT SHOULDER SURGERY  2000   Social History  Substance Use Topics  . Smoking status: Never Smoker  . Smokeless tobacco: Current User    Types: Snuff  . Alcohol use 1.8 oz/week    3 Cans of beer per week   family history includes COPD in his mother; Heart attack in his father.  ROS as above:  Medications: No current outpatient prescriptions on file.   No current facility-administered medications for this visit.    No Known Allergies  Health Maintenance Health Maintenance  Topic Date Due  . Hepatitis C Screening  28-Sep-1961  . HIV Screening  07/23/1976  . INFLUENZA VACCINE  05/29/2017  . COLONOSCOPY  02/17/2023  . TETANUS/TDAP  10/18/2025     Exam:  BP 135/68   Pulse 71   Ht 5\' 8"  (1.727 m)   Wt 163 lb (73.9 kg)   BMI 24.78 kg/m   Wt Readings from Last 10 Encounters:  07/15/17 163 lb (73.9 kg)  01/25/17 162 lb 12 oz (73.8 kg)  05/17/16 168 lb (76.2 kg)  05/11/16 168 lb (76.2  kg)  02/01/16 166 lb (75.3 kg)  11/25/15 167 lb 3.2 oz (75.8 kg)  10/19/15 163 lb (73.9 kg)  08/17/15 170 lb (77.1 kg)  02/16/13 163 lb (73.9 kg)  02/02/13 163 lb (73.9 kg)    Gen: Well NAD HEENT: EOMI,  MMM Lungs: Normal work of breathing. CTABL Heart: RRR no MRG Abd: NABS, Soft. Nondistended, Nontender Exts: Brisk capillary refill, warm and well perfused.  Skin: Small scaly red patch right face 2 consistent with actinic keratosis. Otherwise skin is unremarkable with no concerning lesions.  Cryo-treatment: Consent obtained and timeout performed. 2 scaly actinic keratosis on right face treated with liquid nitrogen for 15 seconds of frost 3 Patient tolerated the procedure well.   No results found for this or any previous visit (from the past 72 hour(s)). No results found.    Assessment and Plan: 56 y.o. male with well adult. Doing well discussed nicotine replacement for oral tobacco use. Additionally treated 2 actinic keratoses with cryo-treatment.  Plan to check basic fasting labs listed below and recheck yearly.   Orders Placed This Encounter  Procedures  . CBC  . COMPLETE METABOLIC PANEL WITH GFR  . Lipid Panel w/reflex Direct LDL  . PSA  . Hepatitis C antibody  . HIV antibody   No orders of the defined types were placed in this encounter.    Discussed warning  signs or symptoms. Please see discharge instructions. Patient expresses understanding.

## 2017-07-15 NOTE — Patient Instructions (Signed)
Thank you for coming in today. Get labs today.  Recheck yearly.  Make sure to use sunscreen and sun protection.  We will get results to you asap.  Consider quitting snuf.  I recommend using nicotine replacement options like gum or lozenges.

## 2017-07-16 LAB — COMPLETE METABOLIC PANEL WITH GFR
AG Ratio: 2.9 (calc) — ABNORMAL HIGH (ref 1.0–2.5)
ALBUMIN MSPROF: 4.7 g/dL (ref 3.6–5.1)
ALKALINE PHOSPHATASE (APISO): 67 U/L (ref 40–115)
ALT: 24 U/L (ref 9–46)
AST: 15 U/L (ref 10–35)
BUN: 9 mg/dL (ref 7–25)
CALCIUM: 9.3 mg/dL (ref 8.6–10.3)
CO2: 29 mmol/L (ref 20–32)
CREATININE: 0.91 mg/dL (ref 0.70–1.33)
Chloride: 100 mmol/L (ref 98–110)
GFR, EST NON AFRICAN AMERICAN: 95 mL/min/{1.73_m2} (ref >=60)
GFR, Est African American: 110 mL/min/{1.73_m2} (ref >=60)
GLOBULIN: 1.6 g/dL — AB (ref 1.9–3.7)
GLUCOSE: 93 mg/dL (ref 65–99)
Potassium: 4.5 mmol/L (ref 3.5–5.3)
Sodium: 135 mmol/L (ref 135–146)
Total Bilirubin: 0.7 mg/dL (ref 0.2–1.2)
Total Protein: 6.3 g/dL (ref 6.1–8.1)

## 2017-07-16 LAB — LIPID PANEL W/REFLEX DIRECT LDL
CHOL/HDL RATIO: 2.9 (calc) (ref <5.0)
CHOLESTEROL: 206 mg/dL — AB (ref <200)
HDL: 71 mg/dL (ref >40)
LDL CHOLESTEROL (CALC): 116 mg/dL — AB
NON-HDL CHOLESTEROL (CALC): 135 mg/dL — AB (ref <130)
Triglycerides: 87 mg/dL (ref <150)

## 2017-07-16 LAB — CBC
HEMATOCRIT: 40.7 % (ref 38.5–50.0)
HEMOGLOBIN: 13.8 g/dL (ref 13.2–17.1)
MCH: 30.4 pg (ref 27.0–33.0)
MCHC: 33.9 g/dL (ref 32.0–36.0)
MCV: 89.6 fL (ref 80.0–100.0)
MPV: 11 fL (ref 7.5–12.5)
Platelets: 198 10*3/uL (ref 140–400)
RBC: 4.54 10*6/uL (ref 4.20–5.80)
RDW: 11.8 % (ref 11.0–15.0)
WBC: 5 10*3/uL (ref 3.8–10.8)

## 2017-07-16 LAB — HEPATITIS C ANTIBODY
HEP C AB: NONREACTIVE
SIGNAL TO CUT-OFF: 0.02 (ref <1.00)

## 2017-07-16 LAB — PSA: PSA: 0.7 ng/mL (ref <=4.0)

## 2017-07-16 LAB — HIV ANTIBODY (ROUTINE TESTING W REFLEX): HIV 1&2 Ab, 4th Generation: NONREACTIVE

## 2017-08-24 ENCOUNTER — Encounter: Payer: Self-pay | Admitting: Emergency Medicine

## 2017-08-24 ENCOUNTER — Emergency Department (INDEPENDENT_AMBULATORY_CARE_PROVIDER_SITE_OTHER)
Admission: EM | Admit: 2017-08-24 | Discharge: 2017-08-24 | Disposition: A | Discharge status: Home / Self Care | Payer: 59 | Source: Home / Self Care | Attending: Family Medicine | Admitting: Family Medicine

## 2017-08-24 DIAGNOSIS — S61210A Laceration without foreign body of right index finger without damage to nail, initial encounter: Secondary | ICD-10-CM | POA: Diagnosis not present

## 2017-08-24 MED ORDER — DOXYCYCLINE HYCLATE 100 MG PO CAPS: 100.0000 mg | ORAL_CAPSULE | Freq: Two times a day (BID) | ORAL | 0 refills | Status: DC

## 2017-08-24 NOTE — ED Provider Notes (Signed)
[name]    CSN: [id number] Arrival date & time: 08/24/17  1410     History   Chief Complaint Chief Complaint  Patient presents with  . Extremity Laceration    HPI Douglas Shah is a 56 y.o. male.   While working on a sewer pipe about 5 hours ago, patient accidentally lacerated his right second finger with a pocket knife.  He did not have an opportunity to lavage the wound, but wrapped it and came here.  His last Tdap was 10/19/15.   The history is provided by the patient and the spouse.  Laceration  Location:  Finger Finger laceration location:  R index finger Length:  1.8cm Depth:  Through dermis Quality: straight   Bleeding: controlled   Time since incident:  5 hours Laceration mechanism:  Knife Pain details:    Quality:  Aching   Severity:  Mild   Timing:  Constant   Progression:  Unchanged Foreign body present:  No foreign bodies Relieved by:  None tried Ineffective treatments:  None tried Tetanus status:  Up to date Associated symptoms: no focal weakness, no numbness and no swelling     Past Medical History:  Diagnosis Date  . Arthritis   . Chronic elbow pain left    Patient Active Problem List   Diagnosis Date Noted  . AK (actinic keratosis) 07/15/2017  . Snuff user 07/15/2017    Past Surgical History:  Procedure Laterality Date  . EXERCISE TREADMILL STRESS TEST  04-19-2008   LOW RISK/ NO ISCHEMIA  . HIP ARTHROSCOPY W/ LABRAL DEBRIDEMENT  03-09-2011   RIGHT HIP  . REPAIR EXTENSOR TENDON  08/15/2012   Procedure: REPAIR EXTENSOR TENDON;  Surgeon: Magnus Sinning, MD;  Location: [facility];  Service: Orthopedics;  Laterality: Left;  WITH PARTIAL LATERAL EPICONDYLECTOMY OF THE LEFT ELBOW   . RIGHT SHOULDER SURGERY  2000       Home Medications    Prior to Admission medications   Medication Sig Start Date End Date Taking? Authorizing Provider  doxycycline (VIBRAMYCIN) 100 MG capsule Take 1 capsule (100 mg  total) by mouth 2 (two) times daily. Take with food. 08/24/17   Kandra Nicolas, MD    Family History Family History  Problem Relation Age of Onset  . COPD Mother   . Heart attack Father   . Colon cancer Neg Hx     Social History Social History  Substance Use Topics  . Smoking status: Never Smoker  . Smokeless tobacco: Current User    Types: Snuff  . Alcohol use 1.8 oz/week    3 Cans of beer per week     Allergies   Patient has no known allergies.   Review of Systems Review of Systems  Neurological: Negative for focal weakness.  All other systems reviewed and are negative.    Physical Exam Triage Vital Signs ED Triage Vitals  Enc Vitals Group     BP 08/24/17 1450 121/87     Pulse --      Resp --      Temp 08/24/17 1450 98.1 F (36.7 C)     Temp Source 08/24/17 1450 Oral     SpO2 08/24/17 1450 97 %     Weight 08/24/17 1451 160 lb (72.6 kg)     Height 08/24/17 1451 5\' 8"  (1.727 m)     Head Circumference --      Peak Flow --      Pain Score 08/24/17  1452 2     Pain Loc --      Pain Edu? --      Excl. in [place]? --    No data found.   Updated Vital Signs BP 121/87 (BP Location: Left Arm)   Temp 98.1 F (36.7 C) (Oral)   Ht 5\' 8"  (1.727 m)   Wt 160 lb (72.6 kg)   SpO2 97%   BMI 24.33 kg/m   Visual Acuity Right Eye Distance:   Left Eye Distance:   Bilateral Distance:    Right Eye Near:   Left Eye Near:    Bilateral Near:     Physical Exam  Constitutional: He appears well-developed and well-nourished. No distress.  HENT:  Head: Atraumatic.  Eyes: Pupils are equal, round, and reactive to light.  Cardiovascular: Normal rate.   Pulmonary/Chest: Effort normal.  Musculoskeletal: Normal range of motion.       Right hand: He exhibits laceration. He exhibits normal range of motion, no tenderness, no bony tenderness, normal two-point discrimination, normal capillary refill, no deformity and no swelling. Normal sensation noted.       Hands: Right  second finger has a simple linear 1.8cm superficial laceration as noted on diagram.  Distal neurovascular function is intact.  Right second finger has normal flexion/extension.  Neurological: He is alert.  Skin: Skin is warm and dry.  Nursing note and vitals reviewed.    UC Treatments / Results  Labs (all labs ordered are listed, but only abnormal results are displayed) Labs Reviewed - No data to display  EKG  EKG Interpretation None       Radiology No results found.  Procedures Procedures  Laceration Repair Discussed benefits and risks of procedure and verbal consent obtained. Using sterile technique and digital anesthesia with 2% lidocaine without epinephrine, cleansed wound with Betadine followed by copious lavage with normal saline.  Wound carefully inspected for debris and foreign bodies; none found.  Wound closed with #4, 4-0 interrupted nylon sutures.  Bacitracin and non-stick sterile dressing applied.  Wound precautions explained to patient.  Return for suture removal in 10 days.   Medications Ordered in UC Medications - No data to display   Initial Impression / Assessment and Plan / UC Course  I have reviewed the triage vital signs and the nursing notes.  Pertinent labs & imaging results that were available during my care of the patient were reviewed by me and considered in my medical decision making (see chart for details).    Because of patient's hand exposure to sewage today, will begin empiric doxycycline for staph coverage. Change dressing daily and apply Bacitracin ointment to wound.  Keep wound clean and dry.  Return for any signs of infection (or follow-up with family doctor):  Increasing redness, swelling, pain, heat, drainage, etc. Return in 10 days for suture removal.      Final Clinical Impressions(s) / UC Diagnoses   Final diagnoses:  Laceration of right index finger without foreign body without damage to nail, initial encounter    New  Prescriptions New Prescriptions   DOXYCYCLINE (VIBRAMYCIN) 100 MG CAPSULE    Take 1 capsule (100 mg total) by mouth 2 (two) times daily. Take with food.         Kandra Nicolas, MD 08/25/17 [phone]

## 2017-08-24 NOTE — Discharge Instructions (Signed)
Change dressing daily and apply Bacitracin ointment to wound.  Keep wound clean and dry.  Return for any signs of infection (or follow-up with family doctor):  Increasing redness, swelling, pain, heat, drainage, etc. °Return in 10 days for suture removal.   °

## 2017-08-24 NOTE — ED Triage Notes (Signed)
Finger Laceration to Right Index Finger

## 2017-11-25 ENCOUNTER — Ambulatory Visit (INDEPENDENT_AMBULATORY_CARE_PROVIDER_SITE_OTHER): Payer: 59 | Admitting: Physician Assistant

## 2017-11-25 ENCOUNTER — Encounter: Payer: Self-pay | Admitting: Physician Assistant

## 2017-11-25 VITALS — BP 136/90 | HR 86 | Temp 98.1°F | Wt 166.0 lb

## 2017-11-25 DIAGNOSIS — N139 Obstructive and reflux uropathy, unspecified: Secondary | ICD-10-CM

## 2017-11-25 LAB — POCT URINALYSIS DIPSTICK
Bilirubin, UA: NEGATIVE
GLUCOSE UA: NEGATIVE
Ketones, UA: NEGATIVE
LEUKOCYTES UA: NEGATIVE
Nitrite, UA: NEGATIVE
Protein, UA: NEGATIVE
RBC UA: NEGATIVE
Spec Grav, UA: 1.01 (ref 1.010–1.025)
Urobilinogen, UA: 0.2 E.U./dL
pH, UA: 7 (ref 5.0–8.0)

## 2017-11-25 MED ORDER — CIPROFLOXACIN HCL 500 MG PO TABS: 500.0000 mg | ORAL_TABLET | Freq: Two times a day (BID) | ORAL | 0 refills | Status: DC

## 2017-11-25 MED ORDER — TAMSULOSIN HCL 0.4 MG PO CAPS: 0.4000 mg | ORAL_CAPSULE | Freq: Every day | ORAL | 0 refills | Status: DC

## 2017-11-25 NOTE — Progress Notes (Signed)
HPI:                                                                Douglas Shah is a 57 y.o. male who presents to [facility] today for LUTS  Pleasant 57 yo M reports progressively worsening obstructive symptoms over the last 6 months. Endorses nocturia 2-3 times nigtly, incomplete bladder empty, post-void dribling, hesitancy, decreased urinary stream, and straining. Also reports intermittent LLQ abdominal discomfort and dysuria. Denies fever, chills, urethral discharge or testicular pain. Sexually active with 1 male partner (wife of over 21 years, monogamous). No history of STI's. History of prostatits years ago. Denies family history of prostate cancer or BPH.  Depression screen [facility] 2/9 07/15/2017  Decreased Interest 0  Down, Depressed, Hopeless 0  PHQ - 2 Score 0    No flowsheet data found.    Past Medical History:  Diagnosis Date  . Arthritis   . Chronic elbow pain left   Past Surgical History:  Procedure Laterality Date  . EXERCISE TREADMILL STRESS TEST  04-19-2008   LOW RISK/ NO ISCHEMIA  . HIP ARTHROSCOPY W/ LABRAL DEBRIDEMENT  03-09-2011   RIGHT HIP  . REPAIR EXTENSOR TENDON  08/15/2012   Procedure: REPAIR EXTENSOR TENDON;  Surgeon: Magnus Sinning, MD;  Location: [facility];  Service: Orthopedics;  Laterality: Left;  WITH PARTIAL LATERAL EPICONDYLECTOMY OF THE LEFT ELBOW   . RIGHT SHOULDER SURGERY  2000   Social History   Tobacco Use  . Smoking status: Never Smoker  . Smokeless tobacco: Current User    Types: Snuff  Substance Use Topics  . Alcohol use: Yes    Alcohol/week: 1.8 oz    Types: 3 Cans of beer per week   family history includes COPD in his mother; Heart attack in his father.    ROS: negative except as noted in the HPI  Medications: Current Outpatient Medications  Medication Sig Dispense Refill  . ciprofloxacin (CIPRO) 500 MG tablet Take 1 tablet (500 mg total) by mouth 2  (two) times daily for 14 days. 28 tablet 0  . tamsulosin (FLOMAX) 0.4 MG CAPS capsule Take 1 capsule (0.4 mg total) by mouth daily after supper. 30 capsule 0   No current facility-administered medications for this visit.    No Known Allergies     Objective:  BP 136/90   Pulse 86   Temp 98.1 F (36.7 C) (Oral)   Wt 166 lb (75.3 kg)   BMI 25.24 kg/m  Gen:  alert, not ill-appearing, no distress, appropriate for age 57: head normocephalic without obvious abnormality, conjunctiva and cornea clear, trachea midline Pulm: Normal work of breathing, normal phonation  Neuro: alert and oriented x 3, no tremor MSK: extremities atraumatic, normal gait and station Skin: intact, no rashes on exposed skin, no jaundice, no cyanosis Psych: well-groomed, cooperative, good eye contact, euthymic mood, affect mood-congruent, speech is articulate, and thought processes clear and goal-directed    Results for orders placed or performed in visit on 11/25/17 (from the past 72 hour(s))  POCT Urinalysis Dipstick     Status: Normal   Collection Time: 11/25/17  4:04 PM  Result Value Ref Range   Color, UA light yellow    Clarity,  UA clear    Glucose, UA negative    Bilirubin, UA negative    Ketones, UA negative    Spec Grav, UA 1.010 1.010 - 1.025   Blood, UA negative    pH, UA 7.0 5.0 - 8.0   Protein, UA negative    Urobilinogen, UA 0.2 0.2 or 1.0 E.U./dL   Nitrite, UA negative    Leukocytes, UA Negative Negative   Appearance     Odor     No results found.    Assessment and Plan: 57 y.o. male with   1. 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 Treating empirically for acute prostatitis given the LLQ pain and hx of prostatitis. Recommend close follow-up in 2 weeks with PCP - ciprofloxacin (CIPRO) 500 MG tablet; Take 1 tablet (500 mg total) by  mouth 2 (two) times daily for 14 days.  Dispense: 28 tablet; Refill: 0 - tamsulosin (FLOMAX) 0.4 MG CAPS capsule; Take 1 capsule (0.4 mg total) by mouth daily after supper.  Dispense: 30 capsule; Refill: 0 - POCT Urinalysis Dipstick negative - Urine Culture pending  Patient education and anticipatory guidance given Patient agrees with treatment plan Follow-up in 2 weeks or sooner as needed if symptoms worsen or fail to improve  Darlyne Russian PA-C

## 2017-11-25 NOTE — Progress Notes (Signed)
oli

## 2017-11-25 NOTE — Patient Instructions (Addendum)
- Cipro twice a day for 2 weeks - Tamsulosin once a day after supper - Follow-up in 2 weeks   Prostatitis Prostatitis is swelling or inflammation of the prostate gland. The prostate is a walnut-sized gland that is involved in the production of semen. It is located below a man's bladder, in front of the rectum. There are four types of prostatitis:  Chronic nonbacterial prostatitis. This is the most common type of prostatitis. It may be associated with a viral infection or autoimmune disorder.  Acute bacterial prostatitis. This is the least common type of prostatitis. It starts quickly and is usually associated with a bladder infection, high fever, and shaking chills. It can occur at any age.  Chronic bacterial prostatitis. This type usually results from acute bacterial prostatitis that happens repeatedly (is recurrent) or has not been treated properly. It can occur in men of any age but is most common among middle-aged men whose prostate has begun to get larger. The symptoms are not as severe as symptoms caused by acute bacterial prostatitis.  Prostatodynia or chronic pelvic pain syndrome (CPPS). This type is also called pelvic floor disorder. It is associated with increased muscular tone in the pelvis surrounding the prostate.  What are the causes? Bacterial prostatitis is caused by infection from bacteria. Chronic nonbacterial prostatitis may be caused by:  Urinary tract infections (UTIs).  Nerve damage.  A response by the body's disease-fighting system (autoimmune response).  Chemicals in the urine.  The causes of the other types of prostatitis are usually not known. What are the signs or symptoms? Symptoms of this condition vary depending upon the type of prostatitis. If you have acute bacterial prostatitis, you may experience:  Urinary symptoms, such as: ? Painful urination. ? Burning during urination. ? Frequent and sudden urges to urinate. ? Inability to start  urinating. ? A weak or interrupted stream of urine.  Vomiting.  Nausea.  Fever.  Chills.  Inability to empty the bladder completely.  Pain in the: ? Muscles or joints. ? Lower back. ? Lower abdomen.  If you have any of the other types of prostatitis, you may experience:  Urinary symptoms, such as: ? Sudden urges to urinate. ? Frequent urination. ? Difficulty starting urination. ? Weak urine stream. ? Dribbling after urination.  Discharge from the urethra. The urethra is a tube that opens at the end of the penis.  Pain in the: ? Testicles. ? Penis or tip of the penis. ? Rectum. ? Area in front of the rectum and below the scrotum (perineum).  Problems with sexual function.  Painful ejaculation.  Bloody semen.  How is this diagnosed? This condition may be diagnosed based on:  A physical and medical exam.  Your symptoms.  A urine test to check for bacteria.  An exam in which a health care provider uses a finger to feel the prostate (digital rectal exam).  A test of a sample of semen.  Blood tests.  Ultrasound.  Removal of prostate tissue to be examined under a microscope (biopsy).  Tests to check how your body handles urine (urodynamic tests).  A test to look inside your bladder or urethra (cystoscopy).  How is this treated? Treatment for this condition depends on the type of prostatitis. Treatment may involve:  Medicines to relieve pain or inflammation.  Medicines to help relax your muscles.  Physical therapy.  Heat therapy.  Techniques to help you control certain body functions (biofeedback).  Relaxation exercises.  Antibiotic medicine, if your condition is  caused by bacteria.  Warm water baths (sitz baths). Sitz baths help with relaxing your pelvic floor muscles, which helps to relieve pressure on the prostate.  Follow these instructions at home:  Take over-the-counter and prescription medicines only as told by your health care  provider.  If you were prescribed an antibiotic, take it as told by your health care provider. Do not stop taking the antibiotic even if you start to feel better.  If physical therapy, biofeedback, or relaxation exercises were prescribed, do exercises as instructed.  Take sitz baths as directed by your health care provider. For a sitz bath, sit in warm water that is deep enough to cover your hips and buttocks.  Keep all follow-up visits as told by your health care provider. This is important. Contact a health care provider if:  Your symptoms get worse.  You have a fever. Get help right away if:  You have chills.  You feel nauseous.  You vomit.  You feel light-headed or feel like you are going to faint.  You are unable to urinate.  You have blood or blood clots in your urine. This information is not intended to replace advice given to you by your health care provider. Make sure you discuss any questions you have with your health care provider. Document Released: 10/12/2000 Document Revised: 07/05/2016 Document Reviewed: 07/05/2016 Elsevier Interactive Patient Education  2017 [employer].

## 2017-11-26 LAB — URINE CULTURE
MICRO NUMBER:: 90116581
Result:: NO GROWTH
SPECIMEN QUALITY:: ADEQUATE

## 2017-11-27 NOTE — Progress Notes (Signed)
Good morning Douglas Shah,  Your urine culture was negative. This does not rule out an infection in your prostate, so I do recommend completing the antibiotics and follow-up as we discussed.  Best, Evlyn Clines

## 2017-12-09 ENCOUNTER — Encounter: Payer: Self-pay | Admitting: Family Medicine

## 2017-12-09 ENCOUNTER — Ambulatory Visit (INDEPENDENT_AMBULATORY_CARE_PROVIDER_SITE_OTHER): Payer: 59 | Admitting: Family Medicine

## 2017-12-09 VITALS — BP 122/91 | HR 77 | Ht 68.0 in | Wt 169.0 lb

## 2017-12-09 DIAGNOSIS — R399 Unspecified symptoms and signs involving the genitourinary system: Secondary | ICD-10-CM | POA: Diagnosis not present

## 2017-12-09 DIAGNOSIS — N529 Male erectile dysfunction, unspecified: Secondary | ICD-10-CM | POA: Diagnosis not present

## 2017-12-09 MED ORDER — SILDENAFIL CITRATE 100 MG PO TABS: 50.0000 mg | ORAL_TABLET | Freq: Every day | ORAL | 11 refills | Status: DC | PRN

## 2017-12-09 NOTE — Patient Instructions (Signed)
Thank you for coming in today. Resume Flomax if you have worsening prostate problems.  Recheck labs in September for a well exam.  Use viagra 50-100mg  prior to sex as neede.d  Let me know if you have any problems.

## 2017-12-09 NOTE — Progress Notes (Signed)
Douglas Shah is a 57 y.o. male who presents to [facility]: [place] today for BPH and acute prostatitis follow up.  Patient prescribed cipro and flomax for acute prostatitis and BPH on 1/28. Patient took medications for 8-9 days. His symptoms have nearly entirely resolved at this time. Patient endorsing no hesitancy, no weak stream, no sensation of incomplete emptying. Patient was previously waking up 2-4 times per night to urinate. Now he is waking up only once to urinate. Patient's quality of life has significantly improved.   Patient does state that he has concurrent ear pressure, shortness of breath, and nasal congestion with initiation of medications. These are not typical side effects of either of these medications but patient was concerned with the shortness of breath. He stopped the medications 2 days ago and the shortness of breath has since resolved. Patient's urinary symptoms have not returned since stopping medications.   Patient also complaining of erectile dysfunction. Symptoms have been worsening over the last year. Patient is able to achieve erection but not to a satisfactory level. He tried Viagra in the past, but the cost was unreasonable at the time. He has not tried Cialis or Viagra in many years.   He has not addressed this problem here yet.     Past Medical History:  Diagnosis Date  . Arthritis   . Chronic elbow pain left   Past Surgical History:  Procedure Laterality Date  . EXERCISE TREADMILL STRESS TEST  04-19-2008   LOW RISK/ NO ISCHEMIA  . HIP ARTHROSCOPY W/ LABRAL DEBRIDEMENT  03-09-2011   RIGHT HIP  . REPAIR EXTENSOR TENDON  08/15/2012   Procedure: REPAIR EXTENSOR TENDON;  Surgeon: Magnus Sinning, MD;  Location: [facility];  Service: Orthopedics;  Laterality: Left;  WITH PARTIAL LATERAL EPICONDYLECTOMY OF THE LEFT ELBOW   . RIGHT  SHOULDER SURGERY  2000   Social History   Tobacco Use  . Smoking status: Never Smoker  . Smokeless tobacco: Current User    Types: Snuff  Substance Use Topics  . Alcohol use: Yes    Alcohol/week: 1.8 oz    Types: 3 Cans of beer per week   family history includes COPD in his mother; Heart attack in his father.  ROS as above:  Medications: Current Outpatient Medications  Medication Sig Dispense Refill  . ofloxacin (OCUFLOX) 0.3 % ophthalmic solution INSTILL 1 DROP INTO RIGHT EYE EVERY 2 HOURS FOR 2 DAYS THEN EVERY 4 HOURS  0  . tamsulosin (FLOMAX) 0.4 MG CAPS capsule Take 1 capsule (0.4 mg total) by mouth daily after supper. 30 capsule 0  . sildenafil (VIAGRA) 100 MG tablet Take 0.5-1 tablets (50-100 mg total) by mouth daily as needed for erectile dysfunction. 10 tablet 11   No current facility-administered medications for this visit.    No Known Allergies  Health Maintenance Health Maintenance  Topic Date Due  . INFLUENZA VACCINE  08/11/2018 (Originally 05/29/2017)  . COLONOSCOPY  02/17/2023  . TETANUS/TDAP  10/18/2025  . Hepatitis C Screening  Completed  . HIV Screening  Completed     Exam:  BP (!) 122/91   Pulse 77   Ht 5\' 8"  (1.727 m)   Wt 169 lb (76.7 kg)   BMI 25.70 kg/m  Gen: Well NAD HEENT: EOMI,  MMM Lungs: Normal work of breathing. CTABL Heart: RRR no MRG Abd: NABS, Soft. Nondistended, Nontender Exts: Brisk capillary refill, warm and well perfused.  Urine culture reviewed no growth.  Lab Results  Component Value Date   PSA 0.7 07/15/2017   PSA 0.58 11/11/2015    Assessment and Plan: 57 y.o. male with resolved acute prostatitis. Patient's symptoms nearly entirely resolved. AUASS since administration of Flomax and Cipro is 3, down from 32 previously. Patient states that even off Flomax, his symptoms have not returned. Likely argues towards acute prostatitis over BPH. Recheck PSA in September   Patient counseled to restart Flomax only if symptoms  return. If he remains asymptomatic, there is no utility in maintenance Flomax.  Patient prescribed Viagra for ED. He has no contraindications and is in good health. Patient not on medications for hypertension or nitrates so low risk for orthostasis. Patient should return to follow up if ED is not improving. This is a new problem.   No orders of the defined types were placed in this encounter.  Meds ordered this encounter  Medications  . sildenafil (VIAGRA) 100 MG tablet    Sig: Take 0.5-1 tablets (50-100 mg total) by mouth daily as needed for erectile dysfunction.    Dispense:  10 tablet    Refill:  11     Discussed warning signs or symptoms. Please see discharge instructions. Patient expresses understanding.

## 2017-12-22 ENCOUNTER — Other Ambulatory Visit: Payer: Self-pay | Admitting: Physician Assistant

## 2017-12-22 DIAGNOSIS — N139 Obstructive and reflux uropathy, unspecified: Secondary | ICD-10-CM

## 2018-01-20 ENCOUNTER — Other Ambulatory Visit: Payer: Self-pay | Admitting: Family Medicine

## 2018-01-20 DIAGNOSIS — N139 Obstructive and reflux uropathy, unspecified: Secondary | ICD-10-CM

## 2018-02-16 ENCOUNTER — Other Ambulatory Visit: Payer: Self-pay | Admitting: Family Medicine

## 2018-02-16 DIAGNOSIS — N139 Obstructive and reflux uropathy, unspecified: Secondary | ICD-10-CM

## 2018-03-25 ENCOUNTER — Ambulatory Visit (INDEPENDENT_AMBULATORY_CARE_PROVIDER_SITE_OTHER): Payer: 59 | Admitting: Family Medicine

## 2018-03-25 ENCOUNTER — Encounter: Payer: Self-pay | Admitting: Family Medicine

## 2018-03-25 VITALS — BP 128/83 | HR 74 | Ht 68.0 in | Wt 166.0 lb

## 2018-03-25 DIAGNOSIS — M6740 Ganglion, unspecified site: Secondary | ICD-10-CM

## 2018-03-25 NOTE — Patient Instructions (Signed)
Thank you for coming in today. Call or go to the ER if you develop a large red swollen joint with extreme pain or oozing puss.  Recheck as needed.  It should go away and stay away.    Ganglion Cyst A ganglion cyst is a noncancerous, fluid-filled lump that occurs near joints or tendons. The ganglion cyst grows out of a joint or the lining of a tendon. It most often develops in the hand or wrist, but it can also develop in the shoulder, elbow, hip, knee, ankle, or foot. The round or oval ganglion cyst can be the size of a pea or larger than a grape. Increased activity may enlarge the size of the cyst because more fluid starts to build up. What are the causes? It is not known what causes a ganglion cyst to grow. However, it may be related to:  Inflammation or irritation around the joint.  An injury.  Repetitive movements or overuse.  Arthritis.  What increases the risk? Risk factors include:  Being a woman.  Being age 70-50.  What are the signs or symptoms? Symptoms may include:  A lump. This most often appears on the hand or wrist, but it can occur in other areas of the body.  Tingling.  Pain.  Numbness.  Muscle weakness.  Weak grip.  Less movement in a joint.  How is this diagnosed? Ganglion cysts are most often diagnosed based on a physical exam. Your health care provider will feel the lump and may shine a light alongside it. If it is a ganglion cyst, a light often shines through it. Your health care provider may order an X-ray, ultrasound, or MRI to rule out other conditions. How is this treated? Ganglion cysts usually go away on their own without treatment. If pain or other symptoms are involved, treatment may be needed. Treatment is also needed if the ganglion cyst limits your movement or if it gets infected. Treatment may include:  Wearing a brace or splint on your wrist or finger.  Taking anti-inflammatory medicine.  Draining fluid from the lump with a needle  (aspiration).  Injecting a steroid into the joint.  Surgery to remove the ganglion cyst.  Follow these instructions at home:  Do not press on the ganglion cyst, poke it with a needle, or hit it.  Take medicines only as directed by your health care provider.  Wear your brace or splint as directed by your health care provider.  Watch your ganglion cyst for any changes.  Keep all follow-up visits as directed by your health care provider. This is important. Contact a health care provider if:  Your ganglion cyst becomes larger or more painful.  You have increased redness, red streaks, or swelling.  You have pus coming from the lump.  You have weakness or numbness in the affected area.  You have a fever or chills. This information is not intended to replace advice given to you by your health care provider. Make sure you discuss any questions you have with your health care provider. Document Released: 10/12/2000 Document Revised: 03/22/2016 Document Reviewed: 03/30/2014 Elsevier Interactive Patient Education  2018 [employer].

## 2018-03-26 NOTE — Progress Notes (Signed)
Douglas Shah is a 57 y.o. male who presents to [facility] today for right shoulder mass.  Douglas Shah notes a palpable nontender firm nodule at the superior aspect of his right shoulder.  He notes is been present for a few days.  He cannot recall any injury.  He denies pain fevers chills nausea vomiting or diarrhea.  He denies any problems with shoulder motion.  He feels pretty well otherwise.    ROS:  As above  Exam:  BP 128/83   Pulse 74   Ht 5\' 8"  (1.727 m)   Wt 166 lb (75.3 kg)   BMI 25.24 kg/m  General: Well Developed, well nourished, and in no acute distress.  Neuro/Psych: Alert and oriented x3, extra-ocular muscles intact, able to move all 4 extremities, sensation grossly intact. Skin: Warm and dry, no rashes noted.  Respiratory: Not using accessory muscles, speaking in full sentences, trachea midline.  Cardiovascular: Pulses palpable, no extremity edema. Abdomen: Does not appear distended. MSK:  Superficial 1 cm firm nontender slightly mobile nodule right superior shoulder just proximal to the [facility] joint. Shoulder is otherwise normal-appearing.  Nontender. Normal shoulder motion.  Negative impingement.    Aspiration and injection of right AC joint ganglion cyst. Consent obtained and timeout performed. Skin superficial to the ganglion cyst cleaned with chlorhexidine cold spray applied and 0.5 mL of lidocaine injected superficially achieving good anesthesia. Skin sterilized again with chlorhexidine.  18-gauge needle used to access the cystic structure.  About 1 mL of gelatinous material was aspirated. The 18-gauge needle was withdrawn and then a 25-gauge needle was used to inject the cyst and around the cyst with 40 mg of Depo-Medrol and 0.1 mL of Marcaine.  Patient tolerated the procedure well.    Lab and Radiology Results Limited musculoskeletal ultrasound reveals a hypoechoic cystic structure just proximal but associated  with the [facility] joint consistent in appearance with ganglion cyst.  Normal bony structures otherwise.  Assessment and Plan: 57 y.o. male with left shoulder AC joint ganglion cyst aspirated and injected today.  Continue conservative management and watchful waiting.  Recheck as needed.    No orders of the defined types were placed in this encounter.  No orders of the defined types were placed in this encounter.   Historical information moved to improve visibility of documentation.  Past Medical History:  Diagnosis Date  . Arthritis   . Chronic elbow pain left   Past Surgical History:  Procedure Laterality Date  . EXERCISE TREADMILL STRESS TEST  04-19-2008   LOW RISK/ NO ISCHEMIA  . HIP ARTHROSCOPY W/ LABRAL DEBRIDEMENT  03-09-2011   RIGHT HIP  . REPAIR EXTENSOR TENDON  08/15/2012   Procedure: REPAIR EXTENSOR TENDON;  Surgeon: Magnus Sinning, MD;  Location: [facility];  Service: Orthopedics;  Laterality: Left;  WITH PARTIAL LATERAL EPICONDYLECTOMY OF THE LEFT ELBOW   . RIGHT SHOULDER SURGERY  2000   Social History   Tobacco Use  . Smoking status: Never Smoker  . Smokeless tobacco: Current User    Types: Snuff  Substance Use Topics  . Alcohol use: Yes    Alcohol/week: 1.8 oz    Types: 3 Cans of beer per week   family history includes COPD in his mother; Heart attack in his father.  Medications: Current Outpatient Medications  Medication Sig Dispense Refill  . ofloxacin (OCUFLOX) 0.3 % ophthalmic solution INSTILL 1 DROP INTO RIGHT EYE EVERY 2 HOURS FOR 2 DAYS THEN  EVERY 4 HOURS  0  . sildenafil (VIAGRA) 100 MG tablet Take 0.5-1 tablets (50-100 mg total) by mouth daily as needed for erectile dysfunction. 10 tablet 11  . tamsulosin (FLOMAX) 0.4 MG CAPS capsule TAKE 1 CAPSULE (0.4 MG TOTAL) BY MOUTH DAILY AFTER SUPPER. 30 capsule 0   No current facility-administered medications for this visit.    No Known Allergies    Discussed warning signs or symptoms.  Please see discharge instructions. Patient expresses understanding.

## 2018-07-07 IMAGING — DX DG STERNUM 2+V
2 series · 2 of 2 positions shown · non-contrast
Comparison: 06/19/2012

CLINICAL DATA: Mid chest/ sternal pain around the sternal angle on
his left side. No bruising. Pt was wrestling with his son 1 week ago
and started feeling this pain. Pt had a chest xray and a sternum
xray
No hx of heart or lung conditions. Non-smoker

EXAM:
STERNUM - 2+ VIEW

[sternum lat]
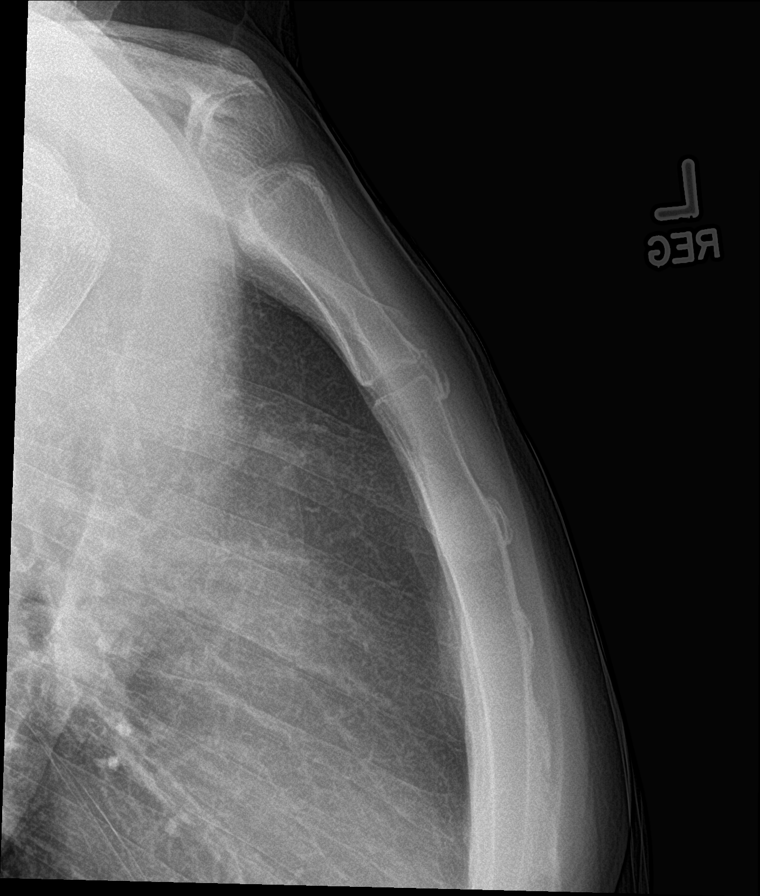

[chest pa]
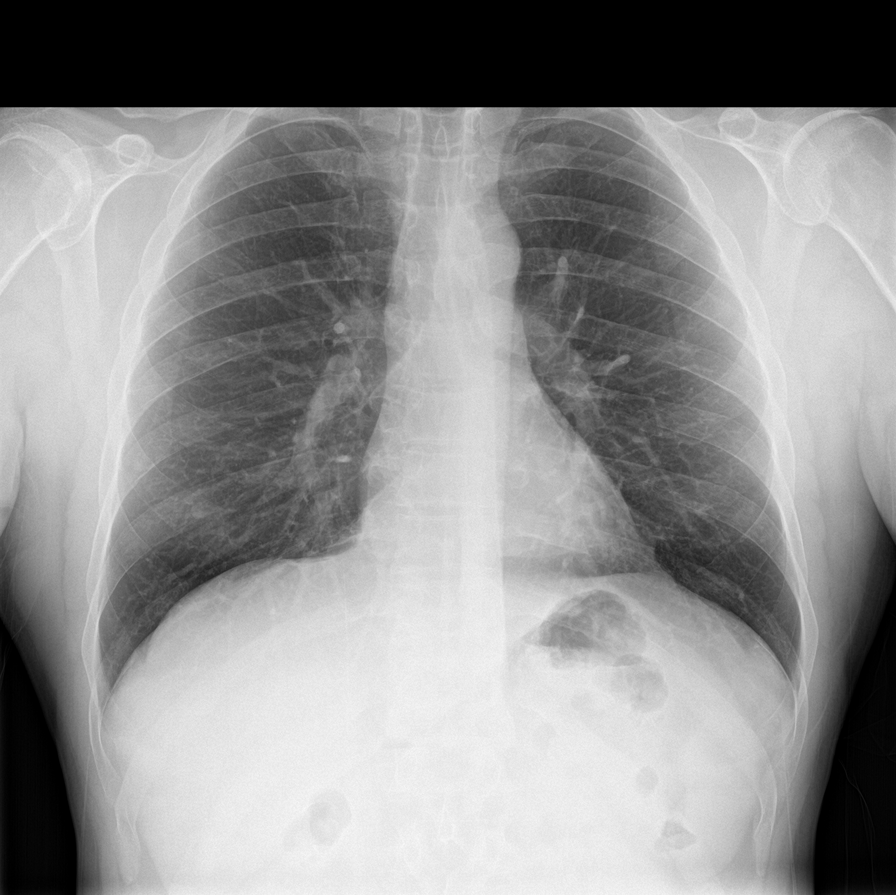

[2 of 2 positions shown; findings below may reference images not displayed]

FINDINGS: There is no evidence of fracture or other focal bone lesions.
IMPRESSION: Negative.

## 2018-07-16 ENCOUNTER — Ambulatory Visit (INDEPENDENT_AMBULATORY_CARE_PROVIDER_SITE_OTHER): Payer: 59 | Admitting: Family Medicine

## 2018-07-16 ENCOUNTER — Encounter: Payer: Self-pay | Admitting: Family Medicine

## 2018-07-16 VITALS — BP 136/82 | HR 63 | Ht 68.0 in | Wt 164.0 lb

## 2018-07-16 DIAGNOSIS — Z0000 Encounter for general adult medical examination without abnormal findings: Secondary | ICD-10-CM

## 2018-07-16 DIAGNOSIS — N400 Enlarged prostate without lower urinary tract symptoms: Secondary | ICD-10-CM | POA: Insufficient documentation

## 2018-07-16 DIAGNOSIS — N139 Obstructive and reflux uropathy, unspecified: Secondary | ICD-10-CM

## 2018-07-16 DIAGNOSIS — N401 Enlarged prostate with lower urinary tract symptoms: Secondary | ICD-10-CM | POA: Diagnosis not present

## 2018-07-16 DIAGNOSIS — R351 Nocturia: Secondary | ICD-10-CM

## 2018-07-16 DIAGNOSIS — E782 Mixed hyperlipidemia: Secondary | ICD-10-CM | POA: Diagnosis not present

## 2018-07-16 DIAGNOSIS — E785 Hyperlipidemia, unspecified: Secondary | ICD-10-CM | POA: Insufficient documentation

## 2018-07-16 DIAGNOSIS — Z6824 Body mass index (BMI) 24.0-24.9, adult: Secondary | ICD-10-CM

## 2018-07-16 MED ORDER — TAMSULOSIN HCL 0.4 MG PO CAPS: 0.4000 mg | ORAL_CAPSULE | Freq: Every day | ORAL | 1 refills | Status: DC | PRN

## 2018-07-16 NOTE — Progress Notes (Signed)
Douglas Shah is a 57 y.o. male who presents to [place]: [place] today for well adult visit.  He has done well in the last year with no acute issues.  He had an episode of prostatitis last year which has since resolved.  He uses Flomax only intermittently.  He notes he occasionally will have nocturia but has significantly less urinary symptoms than previously.  No fevers chills nausea vomiting or diarrhea.  He takes fish oil twice daily for heart risk.  He uses Viagra infrequently as well.  He feels quite well how things are going.  He exercises regularly and tries eat a healthy diet.  He uses sunscreen regularly as well.  He notes he has some darker pigmented skin lesions that are not changing.  AUA symptom score 5/35. Quality of life score 1/6 ROS as above:  Past Medical History:  Diagnosis Date  . Arthritis   . Chronic elbow pain left   Past Surgical History:  Procedure Laterality Date  . EXERCISE TREADMILL STRESS TEST  04-19-2008   LOW RISK/ NO ISCHEMIA  . HIP ARTHROSCOPY W/ LABRAL DEBRIDEMENT  03-09-2011   RIGHT HIP  . REPAIR EXTENSOR TENDON  08/15/2012   Procedure: REPAIR EXTENSOR TENDON;  Surgeon: Magnus Sinning, MD;  Location: [facility];  Service: Orthopedics;  Laterality: Left;  WITH PARTIAL LATERAL EPICONDYLECTOMY OF THE LEFT ELBOW   . RIGHT SHOULDER SURGERY  2000   Social History   Tobacco Use  . Smoking status: Never Smoker  . Smokeless tobacco: Current User    Types: Snuff  Substance Use Topics  . Alcohol use: Yes    Alcohol/week: 3.0 standard drinks    Types: 3 Cans of beer per week   family history includes COPD in his mother; Heart attack in his father.  Medications: Current Outpatient Medications  Medication Sig Dispense Refill  . fluticasone (FLONASE) 50 MCG/ACT nasal spray Place into both nostrils daily.    . Omega-3  Fatty Acids (FISH OIL) 1000 MG CAPS Take 1,000 mg by mouth 2 (two) times daily.    . sildenafil (VIAGRA) 100 MG tablet Take 0.5-1 tablets (50-100 mg total) by mouth daily as needed for erectile dysfunction. 10 tablet 11  . tamsulosin (FLOMAX) 0.4 MG CAPS capsule Take 1 capsule (0.4 mg total) by mouth daily as needed. 90 capsule 1   No current facility-administered medications for this visit.    No Known Allergies  Health Maintenance Health Maintenance  Topic Date Due  . INFLUENZA VACCINE  04/16/2019 (Originally 05/29/2018)  . COLONOSCOPY  02/17/2023  . TETANUS/TDAP  10/18/2025  . Hepatitis C Screening  Completed  . HIV Screening  Completed     Exam:  BP 136/82   Pulse 63   Ht 5\' 8"  (1.727 m)   Wt 164 lb (74.4 kg)   BMI 24.94 kg/m  Wt Readings from Last 5 Encounters:  07/16/18 164 lb (74.4 kg)  03/25/18 166 lb (75.3 kg)  12/09/17 169 lb (76.7 kg)  11/25/17 166 lb (75.3 kg)  08/24/17 160 lb (72.6 kg)      Gen: Well NAD HEENT: EOMI,  MMM Lungs: Normal work of breathing. CTABL Heart: RRR no MRG Abd: NABS, Soft. Nondistended, Nontender Exts: Brisk capillary refill, warm and well perfused.  Psych: Alert and oriented normal speech thought process and affect. Rectal exam:no hemorrhoids.  Prostate is slightly enlarged with no nodules and nontender. Skin: Macular hyperpigmented skin  lesion right face      Depression screen [facility] 2/9 07/16/2018 07/16/2018 07/15/2017  Decreased Interest 0 0 0  Down, Depressed, Hopeless 0 0 0  PHQ - 2 Score 0 0 0          Assessment and Plan: 57 y.o. male with well adult.  Doing well.  Check basic fasting labs listed below.  Recheck PSA based on BPH. Plan for continue current regimen.  Watchful waiting skin lesions.  Recheck yearly if all is well.   Orders Placed This Encounter  Procedures  . CBC  . COMPLETE METABOLIC PANEL WITH GFR  . Lipid Panel w/reflex Direct LDL  . PSA   Meds ordered this encounter  Medications  .  tamsulosin (FLOMAX) 0.4 MG CAPS capsule    Sig: Take 1 capsule (0.4 mg total) by mouth daily as needed.    Dispense:  90 capsule    Refill:  1     Discussed warning signs or symptoms. Please see discharge instructions. Patient expresses understanding.

## 2018-07-16 NOTE — Patient Instructions (Signed)
Thank you for coming in today. Get fasting labs soon.  Do not take fish oil that morning.  Labs is open at 730.   Recheck yearly if all is well.   Call or go to the emergency room if you get worse, have trouble breathing, have chest pains, or palpitations.    Benign Prostatic Hyperplasia Benign prostatic hyperplasia (BPH) is an enlarged prostate gland that is caused by the normal aging process and not by cancer. The prostate is a walnut-sized gland that is involved in the production of semen. It is located in front of the rectum and below the bladder. The bladder stores urine and the urethra is the tube that carries the urine out of the body. The prostate may get bigger as a man gets older. An enlarged prostate can press on the urethra. This can make it harder to pass urine. The build-up of urine in the bladder can cause infection. Back pressure and infection may progress to bladder damage and kidney (renal) failure. What are the causes? This condition is part of a normal aging process. However, not all men develop problems from this condition. If the prostate enlarges away from the urethra, urine flow will not be blocked. If it enlarges toward the urethra and compresses it, there will be problems passing urine. What increases the risk? This condition is more likely to develop in men over the age of 63 years. What are the signs or symptoms? Symptoms of this condition include:  Getting up often during the night to urinate.  Needing to urinate frequently during the day.  Difficulty starting urine flow.  Decrease in size and strength of your urine stream.  Leaking (dribbling) after urinating.  Inability to pass urine. This needs immediate treatment.  Inability to completely empty your bladder.  Pain when you pass urine. This is more common if there is also an infection.  Urinary tract infection (UTI).  How is this diagnosed? This condition is diagnosed based on your medical  history, a physical exam, and your symptoms. Tests will also be done, such as:  A post-void bladder scan. This measures any amount of urine that may remain in your bladder after you finish urinating.  A digital rectal exam. In a rectal exam, your health care provider checks your prostate by putting a lubricated, gloved finger into your rectum to feel the back of your prostate gland. This exam detects the size of your gland and any abnormal lumps or growths.  An exam of your urine (urinalysis).  A prostate specific antigen (PSA) screening. This is a blood test used to screen for prostate cancer.  An ultrasound. This test uses sound waves to electronically produce a picture of your prostate gland.  Your health care provider may refer you to a specialist in kidney and prostate diseases (urologist). How is this treated? Once symptoms begin, your health care provider will monitor your condition (active surveillance or watchful waiting). Treatment for this condition will depend on the severity of your condition. Treatment may include:  Observation and yearly exams. This may be the only treatment needed if your condition and symptoms are mild.  Medicines to relieve your symptoms, including: ? Medicines to shrink the prostate. ? Medicines to relax the muscle of the prostate.  Surgery in severe cases. Surgery may include: ? Prostatectomy. In this procedure, the prostate tissue is removed completely through an open incision or with a laparascope or robotics. ? Transurethral resection of the prostate (TURP). In this procedure, a tool  is inserted through the opening at the tip of the penis (urethra). It is used to cut away tissue of the inner core of the prostate. The pieces are removed through the same opening of the penis. This removes the blockage. ? Transurethral incision (TUIP). In this procedure, small cuts are made in the prostate. This lessens the prostate's pressure on the  urethra. ? Transurethral microwave thermotherapy (TUMT). This procedure uses microwaves to create heat. The heat destroys and removes a small amount of prostate tissue. ? Transurethral needle ablation (TUNA). This procedure uses radio frequencies to destroy and remove a small amount of prostate tissue. ? Interstitial laser coagulation ([place]). This procedure uses a laser to destroy and remove a small amount of prostate tissue. ? Transurethral electrovaporization (TUVP). This procedure uses electrodes to destroy and remove a small amount of prostate tissue. ? Prostatic urethral lift. This procedure inserts an implant to push the lobes of the prostate away from the urethra.  Follow these instructions at home:  Take over-the-counter and prescription medicines only as told by your health care provider.  Monitor your symptoms for any changes. Contact your health care provider with any changes.  Avoid drinking large amounts of liquid before going to bed or out in public.  Avoid or reduce how much caffeine or alcohol you drink.  Give yourself time when you urinate.  Keep all follow-up visits as told by your health care provider. This is important. Contact a health care provider if:  You have unexplained back pain.  Your symptoms do not get better with treatment.  You develop side effects from the medicine you are taking.  Your urine becomes very dark or has a bad smell.  Your lower abdomen becomes distended and you have trouble passing your urine. Get help right away if:  You have a fever or chills.  You suddenly cannot urinate.  You feel lightheaded, or very dizzy, or you faint.  There are large amounts of blood or clots in the urine.  Your urinary problems become hard to manage.  You develop moderate to severe low back or flank pain. The flank is the side of your body between the ribs and the hip. These symptoms may represent a serious problem that is an emergency. Do not wait to  see if the symptoms will go away. Get medical help right away. Call your local emergency services (911 in the [place] Do not drive yourself to the hospital. Summary  Benign prostatic hyperplasia (BPH) is an enlarged prostate that is caused by the normal aging process and not by cancer.  An enlarged prostate can press on the urethra. This can make it hard to pass urine.  This condition is part of a normal aging process and is more likely to develop in men over the age of 33 years.  Get help right away if you suddenly cannot urinate. This information is not intended to replace advice given to you by your health care provider. Make sure you discuss any questions you have with your health care provider. Document Released: 10/15/2005 Document Revised: 11/19/2016 Document Reviewed: 11/19/2016 Elsevier Interactive Patient Education  [employer].

## 2018-08-26 LAB — CBC
HEMATOCRIT: 41.7 % (ref 38.5–50.0)
HEMOGLOBIN: 14 g/dL (ref 13.2–17.1)
MCH: 30.6 pg (ref 27.0–33.0)
MCHC: 33.6 g/dL (ref 32.0–36.0)
MCV: 91.2 fL (ref 80.0–100.0)
MPV: 11.3 fL (ref 7.5–12.5)
Platelets: 207 10*3/uL (ref 140–400)
RBC: 4.57 10*6/uL (ref 4.20–5.80)
RDW: 12 % (ref 11.0–15.0)
WBC: 4.8 10*3/uL (ref 3.8–10.8)

## 2018-08-26 LAB — COMPLETE METABOLIC PANEL WITH GFR
AG Ratio: 2.5 (calc) (ref 1.0–2.5)
ALBUMIN MSPROF: 4.7 g/dL (ref 3.6–5.1)
ALT: 16 U/L (ref 9–46)
AST: 16 U/L (ref 10–35)
Alkaline phosphatase (APISO): 64 U/L (ref 40–115)
BUN: 11 mg/dL (ref 7–25)
CALCIUM: 9.5 mg/dL (ref 8.6–10.3)
CO2: 26 mmol/L (ref 20–32)
CREATININE: 0.96 mg/dL (ref 0.70–1.33)
Chloride: 99 mmol/L (ref 98–110)
GFR, EST AFRICAN AMERICAN: 101 mL/min/{1.73_m2} (ref >=60)
GFR, Est Non African American: 87 mL/min/{1.73_m2} (ref >=60)
GLOBULIN: 1.9 g/dL (ref 1.9–3.7)
GLUCOSE: 91 mg/dL (ref 65–99)
Potassium: 4.8 mmol/L (ref 3.5–5.3)
Sodium: 133 mmol/L — ABNORMAL LOW (ref 135–146)
Total Bilirubin: 0.8 mg/dL (ref 0.2–1.2)
Total Protein: 6.6 g/dL (ref 6.1–8.1)

## 2018-08-26 LAB — LIPID PANEL W/REFLEX DIRECT LDL
CHOL/HDL RATIO: 3.1 (calc) (ref <5.0)
CHOLESTEROL: 211 mg/dL — AB (ref <200)
HDL: 68 mg/dL (ref >40)
LDL CHOLESTEROL (CALC): 126 mg/dL — AB
NON-HDL CHOLESTEROL (CALC): 143 mg/dL — AB (ref <130)
Triglycerides: 74 mg/dL (ref <150)

## 2018-08-26 LAB — PSA: PSA: 0.7 ng/mL (ref <=4.0)

## 2018-10-29 HISTORY — PX: POLYPECTOMY: SHX149

## 2018-10-29 HISTORY — PX: COLONOSCOPY: SHX174

## 2018-12-16 ENCOUNTER — Other Ambulatory Visit: Payer: Self-pay | Admitting: Family Medicine

## 2019-04-08 MED ORDER — GENERIC EXTERNAL MEDICATION
Status: DC
Start: ? — End: 2019-04-08

## 2019-04-08 MED ORDER — SODIUM CHLORIDE 0.9 % IV SOLN
10.00 | INTRAVENOUS | Status: DC
Start: ? — End: 2019-04-08

## 2019-04-27 ENCOUNTER — Other Ambulatory Visit: Payer: Self-pay

## 2019-04-28 ENCOUNTER — Ambulatory Visit (INDEPENDENT_AMBULATORY_CARE_PROVIDER_SITE_OTHER): Payer: 59 | Admitting: Gastroenterology

## 2019-04-28 ENCOUNTER — Ambulatory Visit (INDEPENDENT_AMBULATORY_CARE_PROVIDER_SITE_OTHER): Payer: 59 | Admitting: Family Medicine

## 2019-04-28 ENCOUNTER — Encounter: Payer: Self-pay | Admitting: Gastroenterology

## 2019-04-28 ENCOUNTER — Encounter: Payer: Self-pay | Admitting: Family Medicine

## 2019-04-28 VITALS — BP 135/85 | HR 72 | Temp 98.0°F | Ht 68.0 in | Wt 166.0 lb

## 2019-04-28 VITALS — Ht 68.0 in | Wt 160.0 lb

## 2019-04-28 DIAGNOSIS — R51 Headache: Secondary | ICD-10-CM | POA: Diagnosis not present

## 2019-04-28 DIAGNOSIS — K5792 Diverticulitis of intestine, part unspecified, without perforation or abscess without bleeding: Secondary | ICD-10-CM | POA: Insufficient documentation

## 2019-04-28 DIAGNOSIS — M791 Myalgia, unspecified site: Secondary | ICD-10-CM

## 2019-04-28 DIAGNOSIS — W57XXXA Bitten or stung by nonvenomous insect and other nonvenomous arthropods, initial encounter: Secondary | ICD-10-CM | POA: Diagnosis not present

## 2019-04-28 DIAGNOSIS — S90562A Insect bite (nonvenomous), left ankle, initial encounter: Secondary | ICD-10-CM

## 2019-04-28 DIAGNOSIS — K5732 Diverticulitis of large intestine without perforation or abscess without bleeding: Secondary | ICD-10-CM | POA: Diagnosis not present

## 2019-04-28 DIAGNOSIS — R6883 Chills (without fever): Secondary | ICD-10-CM

## 2019-04-28 DIAGNOSIS — R933 Abnormal findings on diagnostic imaging of other parts of digestive tract: Secondary | ICD-10-CM | POA: Diagnosis not present

## 2019-04-28 DIAGNOSIS — Z20828 Contact with and (suspected) exposure to other viral communicable diseases: Secondary | ICD-10-CM

## 2019-04-28 DIAGNOSIS — Z20822 Contact with and (suspected) exposure to covid-19: Secondary | ICD-10-CM

## 2019-04-28 HISTORY — DX: Diverticulitis of intestine, part unspecified, without perforation or abscess without bleeding: K57.92

## 2019-04-28 MED ORDER — DOXYCYCLINE HYCLATE 100 MG PO TABS: 100.0000 mg | ORAL_TABLET | Freq: Two times a day (BID) | ORAL | 0 refills | Status: DC

## 2019-04-28 MED ORDER — NA SULFATE-K SULFATE-MG SULF 17.5-3.13-1.6 GM/177ML PO SOLN: 1.0000 | Freq: Once | ORAL | 0 refills | Status: AC

## 2019-04-28 NOTE — Progress Notes (Signed)
Douglas Shah is a 58 y.o. male who presents to [place]: [place] today for tick bite and headache.  Patient will suffered a tick bite on Saturday, June 27.  He pulled a [place] tick off of his left ankle and notes that it was there for several hours.  On Sunday he developed headache and body aches and chills.  He never had a measurable fever.  His symptoms persisted till today when they are slowly improving.  He denies any rash.  He denies any cough or shortness of breath however he does note a runny nose and sneezing.  He denies any known exposure to COVID-19.  Has not had much treatment yet.  No vomiting diarrhea chest pain palpitations.  Additionally patient was evaluated for and diagnosed and treated for diverticulitis in the emergency department earlier this month with CT scan.  He feels much better now after Cipro Flagyl.  He is not currently taking any antibiotics.  ROS as above:  Exam:  BP 135/85   Pulse 72   Temp 98 F (36.7 C) (Oral)   Ht 5\' 8"  (1.727 m)   Wt 166 lb (75.3 kg)   SpO2 98%   BMI 25.24 kg/m  Wt Readings from Last 5 Encounters:  04/28/19 166 lb (75.3 kg)  07/16/18 164 lb (74.4 kg)  03/25/18 166 lb (75.3 kg)  12/09/17 169 lb (76.7 kg)  11/25/17 166 lb (75.3 kg)    Gen: Well NAD nontoxic appearing HEENT: EOMI,  MMM no significant nasal discharge. Lungs: Normal work of breathing. CTABL Heart: RRR no MRG Abd: NABS, Soft. Nondistended, Nontender Exts: Brisk capillary refill, warm and well perfused.  Skin: Small erythematous papule left lateral ankle with no surrounding erythema or induration.     Assessment and Plan: 58 y.o. male with tick bite with headache fevers and chills.  Obviously concerning for tickborne illness such as [facility] spotted fever or ehrlichiosis.  However duration of tick bite is probably not sufficient to guarantee  transmission.  Additionally we are currently in COVID-19 pandemic and his symptoms are also consistent with early COVID-19 symptoms.  We discussed options.  Plan for COVID-19 nasal PCR swab now.  Empiric treatment with doxycycline.  I used appropriate PPE including a 95 mask and facial during the visit.  Recommend self-isolation until negative test.  PDMP not reviewed this encounter. Orders Placed This Encounter  Procedures  . SARS-COV-2 RNA,(COVID-19) QUAL NAAT   Meds ordered this encounter  Medications  . doxycycline (VIBRA-TABS) 100 MG tablet    Sig: Take 1 tablet (100 mg total) by mouth 2 (two) times daily.    Dispense:  14 tablet    Refill:  0     Historical information moved to improve visibility of documentation.  Past Medical History:  Diagnosis Date  . Arthritis   . Chronic elbow pain left  . Diverticulitis 04/28/2019   Confirm CT scan ED June 2020   Past Surgical History:  Procedure Laterality Date  . EXERCISE TREADMILL STRESS TEST  04-19-2008   LOW RISK/ NO ISCHEMIA  . HIP ARTHROSCOPY W/ LABRAL DEBRIDEMENT  03-09-2011   RIGHT HIP  . REPAIR EXTENSOR TENDON  08/15/2012   Procedure: REPAIR EXTENSOR TENDON;  Surgeon: Magnus Sinning, MD;  Location: [place];  Service: Orthopedics;  Laterality: Left;  WITH PARTIAL LATERAL EPICONDYLECTOMY OF THE LEFT ELBOW   . [place]   Social  History   Tobacco Use  . Smoking status: Never Smoker  . Smokeless tobacco: Current User    Types: Snuff  Substance Use Topics  . Alcohol use: Yes    Alcohol/week: 3.0 standard drinks    Types: 3 Cans of beer per week   family history includes COPD in his mother; Heart attack in his father.  Medications: Current Outpatient Medications  Medication Sig Dispense Refill  . fluticasone (FLONASE) 50 MCG/ACT nasal spray Place into both nostrils daily.    . Omega-3 Fatty Acids (FISH OIL) 1000 MG CAPS Take 1,000 mg by mouth 2 (two) times daily.    .  sildenafil (VIAGRA) 100 MG tablet Take 0.5-1 tablets (50-100 mg total) by mouth daily as needed for erectile dysfunction. 10 tablet 11  . doxycycline (VIBRA-TABS) 100 MG tablet Take 1 tablet (100 mg total) by mouth 2 (two) times daily. 14 tablet 0   No current facility-administered medications for this visit.    No Known Allergies   Discussed warning signs or symptoms. Please see discharge instructions. Patient expresses understanding.

## 2019-04-28 NOTE — Patient Instructions (Signed)
You have been scheduled for a colonoscopy. Please follow written instructions given to you at your visit today.  Please pick up your prep supplies at the pharmacy within the next 1-3 days. If you use inhalers (even only as needed), please bring them with you on the day of your procedure.  Thank you for choosing me and [facility].  Malcolm T. Stark, Jr., MD., FACG  

## 2019-04-28 NOTE — Patient Instructions (Signed)
Thank you for coming in today. Take doxycycline for possible tick illness.  Treat yourself like you may have COVID 19.  Self isolation.  Ok to take tylneol for pain and fever and fatigue .    [place] Spotted Fever [place] spotted fever is a bacterial infection that spreads to people through contact with certain ticks. The illness causes flu-like symptoms and a reddish-purple rash. The illness does not spread from person to person (is not contagious). When this condition is not treated right away, it can quickly become very serious, and can sometimes lead to long-term (chronic) health problems or even death. What are the causes? This condition is caused by a type of bacteria (Rickettsia rickettsii) that is carried by [place] dog ticks, brown dog ticks, and [employer] wood ticks. The infection spreads through:  A bite from an infected tick. Tick bites are usually painless, and they frequently are not noticed.  Infected tick blood, body fluids, or feces that get into the body through damaged skin, such as a small cut or sore. This could happen while removing a tick from a pet or from another person. What increases the risk? The following factors may make you more likely to develop this condition:  Spending a lot of time outdoors, especially in rural areas or areas with long grass.  Spending time outdoors during warm weather. Ticks are most active during warm weather. What are the signs or symptoms? Symptoms of this condition include:  Fever.  Muscle aches.  Headache.  Nausea.  Vomiting.  Poor appetite.  Abdominal pain.  A reddish-purple rash. ? This usually appears 2-5 days after the first symptoms begin. ? The rash often starts on the wrists, forearms, and ankles. It may then spread to the palms, the bottom of the feet, legs, and trunk. Symptoms may develop 2-14 days after a tick bite. How is this diagnosed? This condition is diagnosed based on:  Your  medical history.  A physical exam.  Blood tests.  Whether you have recently been bitten by a tick or spent time in areas where: ? Ticks are common. ? [facility] spotted fever is common. How is this treated? This condition is treated with antibiotic medicines. It is important to begin treatment right away. In some cases, your health care provider may begin treatment before the diagnosis is confirmed. If your symptoms are severe, you may need to be treated in the hospital where you can get antibiotics and be monitored during treatment. Follow these instructions at home:  Take over-the-counter and prescription medicines only as told by your health care provider.  Take your antibiotic medicine as told by your health care provider. Do not stop taking the antibiotic even if you start to feel better.  Rest as much as possible until you feel better. Return to your normal activities as told by your health care provider.  Drink enough fluid to keep your urine pale yellow.  Keep all follow-up visits as told by your health care provider. This is important. Contact a health care provider if:  You have a rash that gets increasingly red or swollen.  You have fluid draining from any areas of your rash. Get help right away if:  You develop a fever after being bitten by a tick.  You develop a rash 2-5 days after experiencing flu-like symptoms.  You have chest pain.  You have shortness of breath.  You have a severe headache.  You have jerky movements you cannot control (seizure).  You have severe abdominal pain.  You feel confused.  You bruise easily.  You have bleeding from your gums.  You have blood in your stool (feces).  You have trouble controlling when you urinate or have bowel movements (incontinence).  You have vision problems.  You have numbness or tingling in your arms or legs. Summary  [facility] spotted fever is a bacterial infection that spreads to people  through contact with certain ticks.  When this condition is not treated right away, it can quickly become very serious, and can sometimes lead to long-term (chronic) health problems or even death.  You are more likely to develop this infection if you spend time outdoors in warm weather and in areas with tall grass.  Symptoms of this condition include fever, headache, nausea, vomiting, abdominal pain, muscle aches, and a reddish-purple rash that usually appears 2-5 days after a fever.  This condition is treated with antibiotic medicines. This information is not intended to replace advice given to you by your health care provider. Make sure you discuss any questions you have with your health care provider. Document Released: 01/27/2001 Document Revised: 10/18/2017 Document Reviewed: 01/10/2017 Elsevier Patient Education  [place].  This information is directly available on the CDC website: RunningShows.co.za.html    Source:CDC Reference to specific commercial products, manufacturers, companies, or trademarks does not constitute its endorsement or recommendation by the [place], [place], or Centers for [employer] and Prevention.

## 2019-04-28 NOTE — Progress Notes (Signed)
History of Present Illness: This is a 58 year old male referred by Gregor Hams, MD for the evaluation of an abnormal CT of the colon, recent acute diverticulitis. He presented to [facility] ED on 6/10 for evaluation of left lower quadrant pain progressing in severity over several days.  Acute diverticulitis in the left colon was diagnosed following exam and CT scan.  CT AP showed left colon stranding and inflammation without an abscess or other abnormality.  He was treated with a 14-day course of Augmentin and his symptoms completely resolved after several days.  He has no gastrointestinal complaints today.  He underwent colonoscopy in April 2014 which showed an 8 mm hyperplastic polyp and internal hemorrhoids, otherwise unremarkable.  Denies weight loss, constipation, diarrhea, change in stool caliber, melena, hematochezia, nausea, vomiting, dysphagia, reflux symptoms, chest pain.    No Known Allergies Outpatient Medications Prior to Visit  Medication Sig Dispense Refill  . fluticasone (FLONASE) 50 MCG/ACT nasal spray Place into both nostrils daily.    . Omega-3 Fatty Acids (FISH OIL) 1000 MG CAPS Take 1,000 mg by mouth 2 (two) times daily.    . sildenafil (VIAGRA) 100 MG tablet Take 0.5-1 tablets (50-100 mg total) by mouth daily as needed for erectile dysfunction. 10 tablet 11   No facility-administered medications prior to visit.    Past Medical History:  Diagnosis Date  . Arthritis   . Chronic elbow pain left  . Diverticulitis 04/28/2019   Confirm CT scan ED June 2020   Past Surgical History:  Procedure Laterality Date  . EXERCISE TREADMILL STRESS TEST  04-19-2008   LOW RISK/ NO ISCHEMIA  . HIP ARTHROSCOPY W/ LABRAL DEBRIDEMENT  03-09-2011   RIGHT HIP  . REPAIR EXTENSOR TENDON  08/15/2012   Procedure: REPAIR EXTENSOR TENDON;  Surgeon: Magnus Sinning, MD;  Location: [facility];  Service: Orthopedics;  Laterality: Left;  WITH PARTIAL LATERAL EPICONDYLECTOMY OF THE  LEFT ELBOW   . RIGHT SHOULDER SURGERY  2000   Social History   Socioeconomic History  . Marital status: Married    Spouse name: Not on file  . Number of children: Not on file  . Years of education: Not on file  . Highest education level: Not on file  Occupational History  . Not on file  Social Needs  . Financial resource strain: Not on file  . Food insecurity    Worry: Not on file    Inability: Not on file  . Transportation needs    Medical: Not on file    Non-medical: Not on file  Tobacco Use  . Smoking status: Never Smoker  . Smokeless tobacco: Current User    Types: Snuff  Substance and Sexual Activity  . Alcohol use: Yes    Alcohol/week: 3.0 standard drinks    Types: 3 Cans of beer per week  . Drug use: No  . Sexual activity: Not on file  Lifestyle  . Physical activity    Days per week: Not on file    Minutes per session: Not on file  . Stress: Not on file  Relationships  . Social Herbalist on phone: Not on file    Gets together: Not on file    Attends religious service: Not on file    Active member of club or organization: Not on file    Attends meetings of clubs or organizations: Not on file    Relationship status: Not on file  Other Topics  Concern  . Not on file  Social History Narrative  . Not on file   Family History  Problem Relation Age of Onset  . COPD Mother   . Heart attack Father   . Colon cancer Neg Hx         Review of Systems: Pertinent positive and negative review of systems were noted in the above HPI section. All other review of systems were otherwise negative.    Physical Exam: Telemedicine - not performed    Assessment and Recommendations:  1.  Acute diverticulitis, resolved.  Abnormal CT of the left colon likely due to diverticulitis however need to exclude colorectal neoplasms other disorders.  Long-term high-fiber diet with 6 to 8 glasses of water per day.  Schedule colonoscopy. The risks (including bleeding,  perforation, infection, missed lesions, medication reactions and possible hospitalization or surgery if complications occur), benefits, and alternatives to colonoscopy with possible biopsy and possible polypectomy were discussed with the patient and they consent to proceed.     These services were provided via telemedicine, audio and video.  The patient was at home and the provider was in the office, alone.  We discussed the limitations of evaluation and management by telemedicine and the availability of in person appointments.  Patient consented for this telemedicine visit and is aware of possible charges for this service.  Office CMA or LPN participated in this telemedicine service.  Time spent on call: 6 minutes     cc: Gregor Hams, [facility] [address] [place],  [place] [phone]

## 2019-05-05 LAB — SARS-COV-2 RNA,(COVID-19) QUALITATIVE NAAT: SARS CoV2 RNA: NOT DETECTED

## 2019-05-25 ENCOUNTER — Encounter: Payer: Self-pay | Admitting: Gastroenterology

## 2019-06-03 ENCOUNTER — Telehealth: Payer: Self-pay | Admitting: Gastroenterology

## 2019-06-03 NOTE — Telephone Encounter (Signed)
Spoke with patient regarding Covid-19 screening questions. Covid-19 Screening Questions:  Do you now or have you had a fever in the last 14 days? no  Do you have any respiratory symptoms of shortness of breath or cough now or in the last 14 days? no  Do you have any family members or close contacts with diagnosed or suspected Covid-19 in the past 14 days? no  Have you been tested for Covid-19 and found to be positive?  Yes a month ago, Negative  Pt made aware of that care partner may wait in the car or come up to the lobby during the procedure but will need to provide their own mask.

## 2019-06-04 ENCOUNTER — Encounter: Payer: Self-pay | Admitting: Gastroenterology

## 2019-06-04 ENCOUNTER — Ambulatory Visit (AMBULATORY_SURGERY_CENTER): Payer: 59 | Admitting: Gastroenterology

## 2019-06-04 ENCOUNTER — Other Ambulatory Visit: Payer: Self-pay

## 2019-06-04 VITALS — BP 105/74 | HR 72 | Temp 97.8°F | Resp 11 | Ht 68.0 in | Wt 160.0 lb

## 2019-06-04 DIAGNOSIS — R933 Abnormal findings on diagnostic imaging of other parts of digestive tract: Secondary | ICD-10-CM

## 2019-06-04 DIAGNOSIS — D120 Benign neoplasm of cecum: Secondary | ICD-10-CM

## 2019-06-04 DIAGNOSIS — D123 Benign neoplasm of transverse colon: Secondary | ICD-10-CM | POA: Diagnosis not present

## 2019-06-04 DIAGNOSIS — K5732 Diverticulitis of large intestine without perforation or abscess without bleeding: Secondary | ICD-10-CM

## 2019-06-04 DIAGNOSIS — K635 Polyp of colon: Secondary | ICD-10-CM

## 2019-06-04 DIAGNOSIS — D124 Benign neoplasm of descending colon: Secondary | ICD-10-CM | POA: Diagnosis not present

## 2019-06-04 MED ORDER — METRONIDAZOLE 500 MG PO TABS: 500.0000 mg | ORAL_TABLET | Freq: Two times a day (BID) | ORAL | 0 refills | Status: DC

## 2019-06-04 MED ORDER — CIPROFLOXACIN HCL 500 MG PO TABS: 500.0000 mg | ORAL_TABLET | Freq: Two times a day (BID) | ORAL | 0 refills | Status: DC

## 2019-06-04 MED ORDER — SODIUM CHLORIDE 0.9 % IV SOLN: 500.0000 mL | Freq: Once | INTRAVENOUS | Status: DC

## 2019-06-04 NOTE — Progress Notes (Signed)
Pt's states no medical or surgical changes since previsit or office visit.  Jb temps and CW vitals.

## 2019-06-04 NOTE — Op Note (Signed)
[place] [facility] Patient Name: Douglas Shah Procedure Date: 06/04/2019 7:27 AM MRN: [id number] Endoscopist: Ladene Artist , MD Age: 58 Referring MD:  Date of Birth: 04/28/1961 Gender: Male Account #: [id number] Procedure:                Colonoscopy Indications:              Abnormal CT of the GI tract (colon) Medicines:                Monitored Anesthesia Care Procedure:                Pre-Anesthesia Assessment:                           - Prior to the procedure, a History and Physical                            was performed, and patient medications and                            allergies were reviewed. The patient's tolerance of                            previous anesthesia was also reviewed. The risks                            and benefits of the procedure and the sedation                            options and risks were discussed with the patient.                            All questions were answered, and informed consent                            was obtained. Prior Anticoagulants: The patient has                            taken no previous anticoagulant or antiplatelet                            agents. ASA Grade Assessment: II - A patient with                            mild systemic disease. After reviewing the risks                            and benefits, the patient was deemed in                            satisfactory condition to undergo the procedure.                           After obtaining informed consent, the colonoscope  was passed under direct vision. Throughout the                            procedure, the patient's blood pressure, pulse, and                            oxygen saturations were monitored continuously. The                            Colonoscope was introduced through the anus and                            advanced to the the cecum, identified by                            appendiceal orifice and ileocecal  valve. The                            ileocecal valve, appendiceal orifice, and rectum                            were photographed. The quality of the bowel                            preparation was good. The colonoscopy was performed                            without difficulty. The patient tolerated the                            procedure well. Scope In: 7:41:41 AM Scope Out: 7:56:15 AM Scope Withdrawal Time: 0 hours 11 minutes 32 seconds  Total Procedure Duration: 0 hours 14 minutes 34 seconds  Findings:                 The perianal and digital rectal examinations were                            normal.                           Three sessile polyps were found in the descending                            colon, transverse colon and cecum. The polyps were                            6 to 8 mm in size. These polyps were removed with a                            cold snare. Resection and retrieval were complete.                           A few small-mouthed diverticula were found in the  left colon. Purulent discharge and erythema was                            seen in association with one diverticular opening,                            consistent with diverticulitis. There was no                            evidence of diverticular bleeding.                           Internal hemorrhoids were found during                            retroflexion. The hemorrhoids were medium-sized and                            Grade I (internal hemorrhoids that do not prolapse).                           The exam was otherwise without abnormality on                            direct and retroflexion views. Complications:            No immediate complications. Estimated blood loss:                            None. Estimated Blood Loss:     Estimated blood loss: none. Impression:               - Three 6 to 8 mm polyps in the descending colon,                            in the  transverse colon and in the cecum, removed                            with a cold snare. Resected and retrieved.                           - Mild diverticulosis in the left colon. Purulent                            discharge was seen in association with the                            diverticular opening, indicative of diverticulitis.                            There was no evidence of diverticular bleeding.                           - Internal hemorrhoids.                           -  The examination was otherwise normal on direct                            and retroflexion views. Recommendation:           - Repeat colonoscopy date to be determined after                            pending pathology results are reviewed for                            surveillance.                           - Patient has a contact number available for                            emergencies. The signs and symptoms of potential                            delayed complications were discussed with the                            patient. Return to normal activities tomorrow.                            Written discharge instructions were provided to the                            patient.                           - Resume previous diet.                           - Continue present medications.                           - Await pathology results.                           - Cipro (ciprofloxacin) 500 mg PO BID for 7 days.                           - Flagyl (metronidazole) 500 mg PO BID for 7 days. Ladene Artist, MD 06/04/2019 8:03:41 AM This report has been signed electronically.

## 2019-06-04 NOTE — Progress Notes (Signed)
PT taken to PACU. Monitors in place. VSS. Report given to RN. 

## 2019-06-04 NOTE — Patient Instructions (Signed)
Handouts given and medications ordered.  YOU HAD AN ENDOSCOPIC PROCEDURE TODAY AT [facility]:   Refer to the procedure report that was given to you for any specific questions about what was found during the examination.  If the procedure report does not answer your questions, please call your gastroenterologist to clarify.  If you requested that your care partner not be given the details of your procedure findings, then the procedure report has been included in a sealed envelope for you to review at your convenience later.  YOU SHOULD EXPECT: Some feelings of bloating in the abdomen. Passage of more gas than usual.  Walking can help get rid of the air that was put into your GI tract during the procedure and reduce the bloating. If you had a lower endoscopy (such as a colonoscopy or flexible sigmoidoscopy) you may notice spotting of blood in your stool or on the toilet paper. If you underwent a bowel prep for your procedure, you may not have a normal bowel movement for a few days.  Please Note:  You might notice some irritation and congestion in your nose or some drainage.  This is from the oxygen used during your procedure.  There is no need for concern and it should clear up in a day or so.  SYMPTOMS TO REPORT IMMEDIATELY:   Following lower endoscopy (colonoscopy or flexible sigmoidoscopy):  Excessive amounts of blood in the stool  Significant tenderness or worsening of abdominal pains  Swelling of the abdomen that is new, acute  Fever of 100F or higher  For urgent or emergent issues, a gastroenterologist can be reached at any hour by calling [phone].   DIET:  We do recommend a small meal at first, but then you may proceed to your regular diet.  Drink plenty of fluids but you should avoid alcoholic beverages for 24 hours.  ACTIVITY:  You should plan to take it easy for the rest of today and you should NOT DRIVE or use heavy machinery until tomorrow (because of the  sedation medicines used during the test).    FOLLOW UP: Our staff will call the number listed on your records 48-72 hours following your procedure to check on you and address any questions or concerns that you may have regarding the information given to you following your procedure. If we do not reach you, we will leave a message.  We will attempt to reach you two times.  During this call, we will ask if you have developed any symptoms of COVID 19. If you develop any symptoms (ie: fever, flu-like symptoms, shortness of breath, cough etc.) before then, please call [phone].  If you test positive for Covid 19 in the 2 weeks post procedure, please call and report this information to [place].    If any biopsies were taken you will be contacted by phone or by letter within the next 1-3 weeks.  Please call us at [phone] if you have not heard about the biopsies in 3 weeks.    SIGNATURES/CONFIDENTIALITY: You and/or your care partner have signed paperwork which will be entered into your electronic medical record.  These signatures attest to the fact that that the information above on your After Visit Summary has been reviewed and is understood.  Full responsibility of the confidentiality of this discharge information lies with you and/or your care-partner.

## 2019-06-04 NOTE — Progress Notes (Signed)
Called to room to assist during endoscopic procedure.  Patient ID and intended procedure confirmed with present staff. Received instructions for my participation in the procedure from the performing physician.  

## 2019-06-08 ENCOUNTER — Telehealth: Payer: Self-pay | Admitting: *Deleted

## 2019-06-08 ENCOUNTER — Telehealth: Payer: Self-pay

## 2019-06-08 ENCOUNTER — Telehealth: Payer: Self-pay | Admitting: Gastroenterology

## 2019-06-08 ENCOUNTER — Other Ambulatory Visit: Payer: Self-pay | Admitting: *Deleted

## 2019-06-08 MED ORDER — AMOXICILLIN-POT CLAVULANATE 875-125 MG PO TABS: 1.0000 | ORAL_TABLET | Freq: Two times a day (BID) | ORAL | 0 refills | Status: DC

## 2019-06-08 NOTE — Telephone Encounter (Signed)
Left voice mail

## 2019-06-08 NOTE — Telephone Encounter (Signed)
Called, left patient VM. D/C Cipro and Flagyl. Augmentin 875 mg BID for 5 days sent to pharmacy that is on file.

## 2019-06-08 NOTE — Telephone Encounter (Signed)
Spoke to the patient who reports he has been taking Cipro 500 mg BID for 7 days (started on 8/6) and has developed sudden onset of joint (bilateral shoulders, elbows and hands) pain, stiffness, swelling (most noticeable in hands). The patient attributes these new symptoms to the administration of the cipro and is asking for alternate antibiotic therapy. The patient reports he was previously treated with Augmentin and experienced no musculoskeletal side effects. The patient has held today's dose of antibiotics. Please advise.

## 2019-06-08 NOTE — Telephone Encounter (Signed)
Left message for patient to call back  

## 2019-06-08 NOTE — Telephone Encounter (Signed)
DC Cipro and Flagyl Augmentin 875 mg po bid, #10, no refills

## 2019-06-08 NOTE — Progress Notes (Signed)
Entered in error

## 2019-06-08 NOTE — Telephone Encounter (Signed)
  Follow up Call-  Call back number 06/04/2019  Post procedure Call Back phone  # [phone]  Permission to leave phone message Yes  Some recent data might be hidden     Patient questions:  Do you have a fever, pain , or abdominal swelling? No. Pain Score  0 *  Have you tolerated food without any problems? Yes.    Have you been able to return to your normal activities? Yes.    Do you have any questions about your discharge instructions: Diet   No. Medications  No. Follow up visit  No.  Do you have questions or concerns about your Care? No.  Actions: * If pain score is 4 or above: No action needed, pain <4.  1. Have you developed a fever since your procedure? no  2.   Have you had an respiratory symptoms (SOB or cough) since your procedure? no  3.   Have you tested positive for COVID 19 since your procedure no  4.   Have you had any family members/close contacts diagnosed with the COVID 19 since your procedure?  No    If yes to any of these questions please route to Joylene John, RN and Alphonsa Gin, RN.   Spoke with pt regarding his earlier phone call regarding his Cipro reaction- told him that Dr. Lynne Leader nurse [facility] regarding stopping Cipro and Flagyl and to take Augmentin BID for 5 days.  Understanding voiced.

## 2019-06-09 NOTE — Telephone Encounter (Signed)
Left message for patient to call back  

## 2019-06-10 NOTE — Telephone Encounter (Signed)
Patient reports that he did start on Augmentin.

## 2019-06-14 ENCOUNTER — Encounter: Payer: Self-pay | Admitting: Gastroenterology

## 2019-07-20 ENCOUNTER — Ambulatory Visit (INDEPENDENT_AMBULATORY_CARE_PROVIDER_SITE_OTHER): Payer: 59 | Admitting: Family Medicine

## 2019-07-20 ENCOUNTER — Encounter: Payer: Self-pay | Admitting: Family Medicine

## 2019-07-20 ENCOUNTER — Other Ambulatory Visit: Payer: Self-pay

## 2019-07-20 VITALS — BP 110/75 | HR 69 | Temp 97.7°F | Wt 165.0 lb

## 2019-07-20 DIAGNOSIS — E782 Mixed hyperlipidemia: Secondary | ICD-10-CM | POA: Diagnosis not present

## 2019-07-20 DIAGNOSIS — K635 Polyp of colon: Secondary | ICD-10-CM

## 2019-07-20 DIAGNOSIS — N486 Induration penis plastica: Secondary | ICD-10-CM

## 2019-07-20 DIAGNOSIS — N401 Enlarged prostate with lower urinary tract symptoms: Secondary | ICD-10-CM

## 2019-07-20 DIAGNOSIS — Z0000 Encounter for general adult medical examination without abnormal findings: Secondary | ICD-10-CM

## 2019-07-20 DIAGNOSIS — Z6825 Body mass index (BMI) 25.0-25.9, adult: Secondary | ICD-10-CM

## 2019-07-20 DIAGNOSIS — R351 Nocturia: Secondary | ICD-10-CM

## 2019-07-20 HISTORY — DX: Polyp of colon: K63.5

## 2019-07-20 NOTE — Progress Notes (Signed)
Douglas Shah is a 58 y.o. male who presents to [facility] today for well adult visit.   Patient exercises regularly and eats a fairly healthy diet. Had a colonoscopy done last month which revealed a sessile colonic polyp that was resected. Will repeat in 3 years.   He notes that about a month ago he was having sex and his penis became somewhat bent during sex.  He notes since then his penis is bent enough to make sex somewhat difficult and somewhat painful.  He finds this annoying and notes it is not getting better.  He is worried that it may interfere with his life going forward.   ROS as above:    Past Medical History:  Diagnosis Date  . Arthritis   . Chronic elbow pain left  . Diverticulitis 04/28/2019   Confirm CT scan ED June 2020  . Sessile colonic polyp 07/20/2019   Past Surgical History:  Procedure Laterality Date  . EXERCISE TREADMILL STRESS TEST  04-19-2008   LOW RISK/ NO ISCHEMIA  . HIP ARTHROSCOPY W/ LABRAL DEBRIDEMENT  03-09-2011   RIGHT HIP  . REPAIR EXTENSOR TENDON  08/15/2012   Procedure: REPAIR EXTENSOR TENDON;  Surgeon: Magnus Sinning, MD;  Location: [facility];  Service: Orthopedics;  Laterality: Left;  WITH PARTIAL LATERAL EPICONDYLECTOMY OF THE LEFT ELBOW   . RIGHT SHOULDER SURGERY  2000   Social History   Tobacco Use  . Smoking status: Never Smoker  . Smokeless tobacco: Current User    Types: Snuff  Substance Use Topics  . Alcohol use: Yes    Alcohol/week: 3.0 standard drinks    Types: 3 Cans of beer per week   family history includes COPD in his mother; Heart attack in his father.  Medications: Current Outpatient Medications  Medication Sig Dispense Refill  . fluticasone (FLONASE) 50 MCG/ACT nasal spray Place into both nostrils daily.    . Omega-3 Fatty Acids (FISH OIL) 1000 MG CAPS Take 1,000 mg by  mouth 2 (two) times daily.    . sildenafil (VIAGRA) 100 MG tablet Take 0.5-1 tablets (50-100 mg total) by mouth daily as needed for erectile dysfunction. 10 tablet 11   No current facility-administered medications for this visit.    No Known Allergies  Health Maintenance Health Maintenance  Topic Date Due  . INFLUENZA VACCINE  01/27/2020 (Originally 05/30/2019)  . COLONOSCOPY  06/03/2022  . TETANUS/TDAP  10/18/2025  . Hepatitis C Screening  Completed  . HIV Screening  Completed     Exam:  BP 110/75   Pulse 69   Temp 97.7 F (36.5 C) (Oral)   Wt 165 lb (74.8 kg)   BMI 25.09 kg/m  Wt Readings from Last 5 Encounters:  07/20/19 165 lb (74.8 kg)  06/04/19 160 lb (72.6 kg)  04/28/19 160 lb (72.6 kg)  04/28/19 166 lb (75.3 kg)  07/16/18 164 lb (74.4 kg)   Gen: Well NAD HEENT: EOMI,  MMM Lungs: Normal work of breathing. CTABL Heart: RRR no MRG Abd: NABS, Soft. Nondistended, Nontender Exts: Brisk capillary refill, warm and well perfused.  Psych: A&O x 3, Normal speech, thought process, and insight. No concerns for depression or anxiety   Depression screen [facility] 2/9 07/20/2019 07/16/2018 07/16/2018 07/15/2017  Decreased Interest 0 0 0 0  Down, Depressed, Hopeless 0 0 0 0  PHQ - 2 Score 0 0 0 0    Lab and Radiology Results  No results found for this or any previous visit (from the past 72 hour(s)). No results found.    Assessment and Plan: 58 y.o. male here for a well adult visit.  Doing well.  Discussed healthy lifestyle management strategies.  Plan to refer to urology to discuss possible Peryonie disease. We will get labs fasting on or after October 29 is that will be the 1 year since his last labs. Flu vaccine declined today.  I informed patient that I am transitioning to sports medicine only  sports medicine in [place] starting in November.  Happy to see patient for continued sports medicine needs.  Discussed need for new PCP.  Provided some recommendations.   PDMP not reviewed this encounter. Orders Placed This Encounter  Procedures  . CBC  . Lipid Panel w/reflex Direct LDL  . PSA  . COMPLETE METABOLIC PANEL WITH GFR  . Ambulatory referral to Urology    Referral Priority:   Routine    Referral Type:   Consultation    Referral Reason:   Specialty Services Required    Requested Specialty:   Urology    Number of Visits Requested:   1   No orders of the defined types were placed in this encounter.    Discussed warning signs or symptoms. Please see discharge instructions. Patient expresses understanding.  I personally was present and performed or re-performed the history, physical exam and medical decision-making activities of this service and have verified that the service and findings are accurately documented in the student's note. ___________________________________________ Lynne Leader M.D., ABFM., CAQSM. Primary Care and Sports Medicine Adjunct Instructor of [employer] [employer]

## 2019-07-20 NOTE — Patient Instructions (Addendum)
Thank you for coming in today. You should hear from Urology soon.  Let me know if you do not hear anything.  Get fasting labs end of October or early November.  On or after Oct 29th.   Peyronie's disease is something to look up.   Recheck sooner if needed.   I will be moving to full time Sports Medicine in [place] starting on November 1st.  You will still be able to see me for your Sports Medicine or Orthopedic needs at [facility] in [place]. I will still be part of [place].    If you want to stay locally for your Sports Medicine issues Dr. Dianah Field here in [place] will be happy to see you.  Additionally Dr. Clearance Coots at [facility] will be happy to see you for sports medicine issues more locally.   For your primary care needs you are welcome to establish care with Dr. Emeterio Reeve.  We are working quickly to hire more physicians to cover the primary care needs however if you cannot get an appointment with Dr. Sheppard Coil in a timely manner [facility] has locations and openings for primary care services nearby.   [facility] at [facility] [address] . [place] , [place]: [phone] . Behavioral Medicine: [phone] . Fax: [place] at [employer] [address] . [place], [place]: [phone] . Behavioral Medicine: [phone] . Fax: [phone] . Hours (M-F): 7am - Academic librarian At [facility]. [place], [place]: [phone] . Behavioral Medicine: [phone] . Fax: [phone] . Hours (M-F): 8am - Optician, dispensing at [employer] . [place], [place] Phone: [phone] . Behavioral Medicine: [phone] . Fax: [phone]

## 2019-07-30 ENCOUNTER — Encounter: Payer: Self-pay | Admitting: Family Medicine

## 2019-08-27 LAB — COMPLETE METABOLIC PANEL WITH GFR
AG Ratio: 2.8 (calc) — ABNORMAL HIGH (ref 1.0–2.5)
ALT: 24 U/L (ref 9–46)
AST: 16 U/L (ref 10–35)
Albumin: 4.7 g/dL (ref 3.6–5.1)
Alkaline phosphatase (APISO): 66 U/L (ref 35–144)
BUN: 8 mg/dL (ref 7–25)
CO2: 27 mmol/L (ref 20–32)
Calcium: 9.2 mg/dL (ref 8.6–10.3)
Chloride: 99 mmol/L (ref 98–110)
Creat: 0.84 mg/dL (ref 0.70–1.33)
GFR, Est African American: 112 mL/min/{1.73_m2} (ref >=60)
GFR, Est Non African American: 97 mL/min/{1.73_m2} (ref >=60)
Globulin: 1.7 g/dL (calc) — ABNORMAL LOW (ref 1.9–3.7)
Glucose, Bld: 93 mg/dL (ref 65–99)
Potassium: 4.3 mmol/L (ref 3.5–5.3)
Sodium: 133 mmol/L — ABNORMAL LOW (ref 135–146)
Total Bilirubin: 0.6 mg/dL (ref 0.2–1.2)
Total Protein: 6.4 g/dL (ref 6.1–8.1)

## 2019-08-27 LAB — PSA: PSA: 0.5 ng/mL (ref <=4.0)

## 2019-08-27 LAB — LIPID PANEL W/REFLEX DIRECT LDL
Cholesterol: 204 mg/dL — ABNORMAL HIGH (ref <200)
HDL: 62 mg/dL (ref >=40)
LDL Cholesterol (Calc): 125 mg/dL (calc) — ABNORMAL HIGH
Non-HDL Cholesterol (Calc): 142 mg/dL (calc) — ABNORMAL HIGH (ref <130)
Total CHOL/HDL Ratio: 3.3 (calc) (ref <5.0)
Triglycerides: 80 mg/dL (ref <150)

## 2019-08-27 LAB — CBC
HCT: 42.4 % (ref 38.5–50.0)
Hemoglobin: 14.4 g/dL (ref 13.2–17.1)
MCH: 31 pg (ref 27.0–33.0)
MCHC: 34 g/dL (ref 32.0–36.0)
MCV: 91.2 fL (ref 80.0–100.0)
MPV: 11 fL (ref 7.5–12.5)
Platelets: 212 10*3/uL (ref 140–400)
RBC: 4.65 10*6/uL (ref 4.20–5.80)
RDW: 12.3 % (ref 11.0–15.0)
WBC: 4.4 10*3/uL (ref 3.8–10.8)

## 2020-03-21 ENCOUNTER — Emergency Department (INDEPENDENT_AMBULATORY_CARE_PROVIDER_SITE_OTHER)
Admission: EM | Admit: 2020-03-21 | Discharge: 2020-03-21 | Disposition: A | Discharge status: Home / Self Care | Payer: 59 | Source: Home / Self Care

## 2020-03-21 ENCOUNTER — Other Ambulatory Visit: Payer: Self-pay

## 2020-03-21 DIAGNOSIS — K1379 Other lesions of oral mucosa: Secondary | ICD-10-CM

## 2020-03-21 MED ORDER — MAGIC MOUTHWASH: 10.0000 mL | Freq: Three times a day (TID) | ORAL | 0 refills | Status: AC | PRN

## 2020-03-21 NOTE — ED Provider Notes (Signed)
[facility]    CSN: [id number] Arrival date & time: 03/21/20  1810      History   Chief Complaint Chief Complaint  Patient presents with  . Mouth Problem    HPI Douglas Shah is a 59 y.o. male.   HPI Douglas Shah is a 59 y.o. male presenting to UC with c/o sore irritating areas on his inner lower lips and tongue that started about 3-4 days ago.  His wife encouraged him to be evaluated for possible thrush. Pt wears a mouth guard at night and use to keep it in a dry plastic container but recently someone told him to keep it in a cup of clean water when not wearing. He admits to not always changing the water out and wonders if that has caused his irritation. No hx of thrush in the past. Denies sore throat or tooth pain. Pt leaves for [place] soon and wants to get this taken care of before he leaves.    Past Medical History:  Diagnosis Date  . Arthritis   . Chronic elbow pain left  . Diverticulitis 04/28/2019   Confirm CT scan ED June 2020  . Sessile colonic polyp 07/20/2019    Patient Active Problem List   Diagnosis Date Noted  . Sessile colonic polyp 07/20/2019  . Diverticulitis 04/28/2019  . BPH (benign prostatic hyperplasia) 07/16/2018  . HLD (hyperlipidemia) 07/16/2018  . ED (erectile dysfunction) 12/09/2017  . AK (actinic keratosis) 07/15/2017  . Snuff user 07/15/2017    Past Surgical History:  Procedure Laterality Date  . EXERCISE TREADMILL STRESS TEST  04-19-2008   LOW RISK/ NO ISCHEMIA  . HIP ARTHROSCOPY W/ LABRAL DEBRIDEMENT  03-09-2011   RIGHT HIP  . REPAIR EXTENSOR TENDON  08/15/2012   Procedure: REPAIR EXTENSOR TENDON;  Surgeon: Magnus Sinning, MD;  Location: [facility];  Service: Orthopedics;  Laterality: Left;  WITH PARTIAL LATERAL EPICONDYLECTOMY OF THE LEFT ELBOW   . RIGHT SHOULDER SURGERY  2000       Home Medications    Prior to Admission medications   Medication Sig Start Date End Date Taking? Authorizing  Provider  fluticasone (FLONASE) 50 MCG/ACT nasal spray Place into both nostrils daily.    [provider]  magic mouthwash SOLN Take 10 mLs by mouth 3 (three) times daily as needed for up to 5 days for mouth pain. Swish, gargle for 60 seconds then spit 03/21/20 03/26/20  Noe Gens, PA-C  Omega-3 Fatty Acids (FISH OIL) 1000 MG CAPS Take 1,000 mg by mouth 2 (two) times daily.    [provider]  sildenafil (VIAGRA) 100 MG tablet Take 0.5-1 tablets (50-100 mg total) by mouth daily as needed for erectile dysfunction. 12/17/18   Gregor Hams, MD    Family History Family History  Problem Relation Age of Onset  . COPD Mother   . Heart attack Father   . Colon cancer Neg Hx     Social History Social History   Tobacco Use  . Smoking status: Never Smoker  . Smokeless tobacco: Current User    Types: Snuff  Substance Use Topics  . Alcohol use: Yes    Alcohol/week: 3.0 standard drinks    Types: 3 Cans of beer per week    Comment: socially  . Drug use: No     Allergies   Patient has no known allergies.   Review of Systems Review of Systems  Constitutional: Negative for chills and fever.  HENT:  Positive for mouth sores. Negative for facial swelling and sore throat.      Physical Exam Triage Vital Signs ED Triage Vitals  Enc Vitals Group     BP 03/21/20 1824 (!) 152/83     Pulse Rate 03/21/20 1824 83     Resp 03/21/20 1824 16     Temp 03/21/20 1824 98.1 F (36.7 C)     Temp Source 03/21/20 1824 Oral     SpO2 03/21/20 1824 98 %     Weight --      Height --      Head Circumference --      Peak Flow --      Pain Score 03/21/20 1822 2     Pain Loc --      Pain Edu? --      Excl. in [place]? --    No data found.  Updated Vital Signs BP (!) 152/83 (BP Location: Right Arm) Comment: pt worked out pta  Pulse 83   Temp 98.1 F (36.7 C) (Oral)   Resp 16   SpO2 98%   Visual Acuity Right Eye Distance:   Left Eye Distance:   Bilateral Distance:    Right  Eye Near:   Left Eye Near:    Bilateral Near:     Physical Exam Vitals and nursing note reviewed.  Constitutional:      Appearance: He is well-developed.  HENT:     Head: Normocephalic and atraumatic.     Mouth/Throat:     Lips: Pink.     Mouth: Mucous membranes are moist. Oral lesions present.     Pharynx: Oropharynx is clear. Uvula midline. No pharyngeal swelling, oropharyngeal exudate, posterior oropharyngeal erythema or uvula swelling.     Tonsils: No tonsillar exudate or tonsillar abscesses.   Cardiovascular:     Rate and Rhythm: Normal rate.  Pulmonary:     Effort: Pulmonary effort is normal.  Musculoskeletal:        General: Normal range of motion.     Cervical back: Normal range of motion.  Skin:    General: Skin is warm and dry.  Neurological:     Mental Status: He is alert and oriented to person, place, and time.  Psychiatric:        Behavior: Behavior normal.      UC Treatments / Results  Labs (all labs ordered are listed, but only abnormal results are displayed) Labs Reviewed - No data to display  EKG   Radiology No results found.  Procedures Procedures (including critical care time)  Medications Ordered in UC Medications - No data to display  Initial Impression / Assessment and Plan / UC Course  I have reviewed the triage vital signs and the nursing notes.  Pertinent labs & imaging results that were available during my care of the patient were reviewed by me and considered in my medical decision making (see chart for details).     Exam shows areas of irritation Will have pt try magic mouthwash Encouraged f/u with PCP or dentist if not improving  Final Clinical Impressions(s) / UC Diagnoses   Final diagnoses:  Sore mouth     Discharge Instructions      You may use the prescribed mouthwash to help with mouth irritation. Try to avoid spicy, sour, or acidic food until your mouth starts to feel better. Follow up with family medicine or  your dentist later this week if not improving.     ED Prescriptions  Medication Sig Dispense Auth. Provider   magic mouthwash SOLN Take 10 mLs by mouth 3 (three) times daily as needed for up to 5 days for mouth pain. Swish, gargle for 60 seconds then spit 75 mL Noe Gens, PA-C     PDMP not reviewed this encounter.   Noe Gens, [place] 03/22/20 [phone]

## 2020-03-21 NOTE — ED Triage Notes (Signed)
Patient presents to Urgent Care with complaints of sore/ irritating areas on his inner lips and his tongue, generalized since about 4 days ago. Patient reports his wife thinks he has thrust, no white discharge noted. Pt states he uses a mouth guard at night, does clean it before use and keeps it soaking in water during the day.

## 2020-03-21 NOTE — Discharge Instructions (Signed)
  You may use the prescribed mouthwash to help with mouth irritation. Try to avoid spicy, sour, or acidic food until your mouth starts to feel better. Follow up with family medicine or your dentist later this week if not improving.

## 2020-09-02 ENCOUNTER — Other Ambulatory Visit: Payer: Self-pay | Admitting: Family Medicine

## 2020-09-02 NOTE — Telephone Encounter (Signed)
Please advise.  No new PCP.

## 2020-10-03 ENCOUNTER — Encounter: Payer: Self-pay | Admitting: Family Medicine

## 2020-10-03 ENCOUNTER — Other Ambulatory Visit: Payer: Self-pay

## 2020-10-03 ENCOUNTER — Ambulatory Visit (INDEPENDENT_AMBULATORY_CARE_PROVIDER_SITE_OTHER): Payer: 59 | Admitting: Family Medicine

## 2020-10-03 VITALS — BP 137/70 | HR 75 | Temp 98.6°F | Wt 167.9 lb

## 2020-10-03 DIAGNOSIS — N529 Male erectile dysfunction, unspecified: Secondary | ICD-10-CM

## 2020-10-03 DIAGNOSIS — N401 Enlarged prostate with lower urinary tract symptoms: Secondary | ICD-10-CM | POA: Diagnosis not present

## 2020-10-03 DIAGNOSIS — E782 Mixed hyperlipidemia: Secondary | ICD-10-CM | POA: Diagnosis not present

## 2020-10-03 DIAGNOSIS — R351 Nocturia: Secondary | ICD-10-CM

## 2020-10-03 MED ORDER — SILDENAFIL CITRATE 100 MG PO TABS: 50.0000 mg | ORAL_TABLET | Freq: Every day | ORAL | 11 refills | Status: DC | PRN

## 2020-10-03 NOTE — Assessment & Plan Note (Signed)
Sildenafil is working well for him.  Will continue at current dose. Reminded of precautions with this medication.

## 2020-10-03 NOTE — Assessment & Plan Note (Signed)
No significant LUT's Update PSA.

## 2020-10-03 NOTE — Patient Instructions (Signed)
Nice to meet you today! Have labs completed. We'll be in touch with results.  Follow up in 1 year or sooner if needed.

## 2020-10-03 NOTE — Progress Notes (Signed)
Douglas Shah - 59 y.o. male MRN [id number]  Date of birth: 02/16/1961  Subjective Chief Complaint  Patient presents with  . Medication Refill    .Marland Kitchen   Douglas Shah is a 59 y.o. male here today for follow up of ED and HLD.  He reports that he is doing well and denies significant changes to his health since last visit.  He stays pretty active and tries to keep his weight down through his diet.    He received initial COVID vaccine however had adverse reaction so he decided not to get a second.    Sildenafil continues to work pretty well for him.  He is not having side effects.     ROS:  A comprehensive ROS was completed and negative except as noted per HPI    No Known Allergies  Past Medical History:  Diagnosis Date  . Arthritis   . Chronic elbow pain left  . Diverticulitis 04/28/2019   Confirm CT scan ED June 2020  . Sessile colonic polyp 07/20/2019    Past Surgical History:  Procedure Laterality Date  . EXERCISE TREADMILL STRESS TEST  04-19-2008   LOW RISK/ NO ISCHEMIA  . HIP ARTHROSCOPY W/ LABRAL DEBRIDEMENT  03-09-2011   RIGHT HIP  . REPAIR EXTENSOR TENDON  08/15/2012   Procedure: REPAIR EXTENSOR TENDON;  Surgeon: Magnus Sinning, MD;  Location: [facility];  Service: Orthopedics;  Laterality: Left;  WITH PARTIAL LATERAL EPICONDYLECTOMY OF THE LEFT ELBOW   . RIGHT SHOULDER SURGERY  2000    Social History   Socioeconomic History  . Marital status: Married    Spouse name: Not on file  . Number of children: Not on file  . Years of education: Not on file  . Highest education level: Not on file  Occupational History  . Not on file  Tobacco Use  . Smoking status: Never Smoker  . Smokeless tobacco: Current User    Types: Snuff  Substance and Sexual Activity  . Alcohol use: Yes    Alcohol/week: 3.0 standard drinks    Types: 3 Cans of beer per week    Comment: socially  . Drug use: No  . Sexual activity: Not on file  Other Topics Concern   . Not on file  Social History Narrative  . Not on file   Social Determinants of Health   Financial Resource Strain:   . Difficulty of Paying Living Expenses: Not on file  Food Insecurity:   . Worried About Charity fundraiser in the Last Year: Not on file  . Ran Out of Food in the Last Year: Not on file  Transportation Needs:   . Lack of Transportation (Medical): Not on file  . Lack of Transportation (Non-Medical): Not on file  Physical Activity:   . Days of Exercise per Week: Not on file  . Minutes of Exercise per Session: Not on file  Stress:   . Feeling of Stress : Not on file  Social Connections:   . Frequency of Communication with Friends and Family: Not on file  . Frequency of Social Gatherings with Friends and Family: Not on file  . Attends Religious Services: Not on file  . Active Member of Clubs or Organizations: Not on file  . Attends Archivist Meetings: Not on file  . Marital Status: Not on file    Family History  Problem Relation Age of Onset  . COPD Mother   . Heart attack Father   .  Colon cancer Neg Hx     Health Maintenance  Topic Date Due  . INFLUENZA VACCINE  01/26/2021 (Originally 05/29/2020)  . COVID-19 Vaccine (2 - Pfizer 2-dose series) 10/19/2021 (Originally 03/04/2020)  . COLONOSCOPY  06/03/2022  . TETANUS/TDAP  10/18/2025  . Hepatitis C Screening  Completed  . HIV Screening  Completed     ----------------------------------------------------------------------------------------------------------------------------------------------------------------------------------------------------------------- Physical Exam BP 137/70 (BP Location: Left Arm, Patient Position: Sitting, Cuff Size: Normal)   Pulse 75   Temp 98.6 F (37 C)   Wt 167 lb 14.4 oz (76.2 kg)   SpO2 100%   BMI 25.53 kg/m   Physical Exam Constitutional:      Appearance: Normal appearance.  HENT:     Head: Normocephalic and atraumatic.  Eyes:     General: No scleral  icterus. Cardiovascular:     Rate and Rhythm: Normal rate and regular rhythm.  Pulmonary:     Effort: Pulmonary effort is normal.     Breath sounds: Normal breath sounds.  Musculoskeletal:     Cervical back: Neck supple.  Neurological:     General: No focal deficit present.     Mental Status: He is alert.  Psychiatric:        Mood and Affect: Mood normal.        Behavior: Behavior normal.     ------------------------------------------------------------------------------------------------------------------------------------------------------------------------------------------------------------------- Assessment and Plan  BPH (benign prostatic hyperplasia) No significant LUT's Update PSA.  HLD (hyperlipidemia) Unable to come back when fasting due to schedule.  Checking D-LDL today.   ED (erectile dysfunction) Sildenafil is working well for him.  Will continue at current dose. Reminded of precautions with this medication.    Meds ordered this encounter  Medications  . sildenafil (VIAGRA) 100 MG tablet    Sig: Take 0.5-1 tablets (50-100 mg total) by mouth daily as needed for erectile dysfunction.    Dispense:  10 tablet    Refill:  11   Orders Placed This Encounter  Procedures  . COMPLETE METABOLIC PANEL WITH GFR  . CBC  . Direct LDL  . PSA     No follow-ups on file.    This visit occurred during the SARS-CoV-2 public health emergency.  Safety protocols were in place, including screening questions prior to the visit, additional usage of staff PPE, and extensive cleaning of exam room while observing appropriate contact time as indicated for disinfecting solutions.

## 2020-10-03 NOTE — Assessment & Plan Note (Signed)
Unable to come back when fasting due to schedule.  Checking D-LDL today.

## 2020-10-04 LAB — PSA: PSA: 0.58 ng/mL (ref <=4.0)

## 2020-10-04 LAB — COMPLETE METABOLIC PANEL WITH GFR
AG Ratio: 2.8 (calc) — ABNORMAL HIGH (ref 1.0–2.5)
ALT: 23 U/L (ref 9–46)
AST: 18 U/L (ref 10–35)
Albumin: 4.7 g/dL (ref 3.6–5.1)
Alkaline phosphatase (APISO): 67 U/L (ref 35–144)
BUN: 11 mg/dL (ref 7–25)
CO2: 28 mmol/L (ref 20–32)
Calcium: 9.6 mg/dL (ref 8.6–10.3)
Chloride: 97 mmol/L — ABNORMAL LOW (ref 98–110)
Creat: 0.86 mg/dL (ref 0.70–1.33)
GFR, Est African American: 110 mL/min/{1.73_m2} (ref >=60)
GFR, Est Non African American: 95 mL/min/{1.73_m2} (ref >=60)
Globulin: 1.7 g/dL (calc) — ABNORMAL LOW (ref 1.9–3.7)
Glucose, Bld: 97 mg/dL (ref 65–139)
Potassium: 5.1 mmol/L (ref 3.5–5.3)
Sodium: 132 mmol/L — ABNORMAL LOW (ref 135–146)
Total Bilirubin: 0.7 mg/dL (ref 0.2–1.2)
Total Protein: 6.4 g/dL (ref 6.1–8.1)

## 2020-10-04 LAB — CBC
HCT: 40.5 % (ref 38.5–50.0)
Hemoglobin: 13.8 g/dL (ref 13.2–17.1)
MCH: 31.2 pg (ref 27.0–33.0)
MCHC: 34.1 g/dL (ref 32.0–36.0)
MCV: 91.6 fL (ref 80.0–100.0)
MPV: 11.2 fL (ref 7.5–12.5)
Platelets: 211 10*3/uL (ref 140–400)
RBC: 4.42 10*6/uL (ref 4.20–5.80)
RDW: 11.8 % (ref 11.0–15.0)
WBC: 5.4 10*3/uL (ref 3.8–10.8)

## 2020-10-04 LAB — LDL CHOLESTEROL, DIRECT: Direct LDL: 125 mg/dL — ABNORMAL HIGH (ref <100)

## 2020-11-16 ENCOUNTER — Other Ambulatory Visit: Payer: Self-pay

## 2020-11-16 ENCOUNTER — Telehealth: Payer: Self-pay

## 2020-11-16 DIAGNOSIS — H9193 Unspecified hearing loss, bilateral: Secondary | ICD-10-CM

## 2020-11-16 NOTE — Telephone Encounter (Signed)
Patient's spouse called requesting an ENT referral for hearing aids. Pt was previously seen by Dr. Ronette Deter but he has moved.

## 2020-11-16 NOTE — Telephone Encounter (Signed)
Ok to place ref to ENT

## 2020-11-16 NOTE — Telephone Encounter (Signed)
Referral placed.

## 2020-11-16 NOTE — Addendum Note (Signed)
Addended byAnnamaria Helling on: 11/16/2020 05:11 PM   Modules accepted: Orders

## 2020-11-21 ENCOUNTER — Encounter: Payer: Self-pay | Admitting: Physician Assistant

## 2020-11-21 ENCOUNTER — Other Ambulatory Visit: Payer: Self-pay

## 2020-11-21 ENCOUNTER — Ambulatory Visit (INDEPENDENT_AMBULATORY_CARE_PROVIDER_SITE_OTHER): Payer: 59 | Admitting: Physician Assistant

## 2020-11-21 VITALS — BP 120/81 | HR 85 | Ht 68.0 in | Wt 166.0 lb

## 2020-11-21 DIAGNOSIS — K4090 Unilateral inguinal hernia, without obstruction or gangrene, not specified as recurrent: Secondary | ICD-10-CM

## 2020-11-21 DIAGNOSIS — R3912 Poor urinary stream: Secondary | ICD-10-CM | POA: Diagnosis not present

## 2020-11-21 DIAGNOSIS — M5136 Other intervertebral disc degeneration, lumbar region: Secondary | ICD-10-CM | POA: Diagnosis not present

## 2020-11-21 DIAGNOSIS — M545 Low back pain, unspecified: Secondary | ICD-10-CM | POA: Diagnosis not present

## 2020-11-21 DIAGNOSIS — N401 Enlarged prostate with lower urinary tract symptoms: Secondary | ICD-10-CM | POA: Diagnosis not present

## 2020-11-21 LAB — POCT URINALYSIS DIP (CLINITEK)
Bilirubin, UA: NEGATIVE
Blood, UA: NEGATIVE
Glucose, UA: NEGATIVE mg/dL
Ketones, POC UA: NEGATIVE mg/dL
Leukocytes, UA: NEGATIVE
Nitrite, UA: NEGATIVE
POC PROTEIN,UA: NEGATIVE
Spec Grav, UA: 1.01 (ref 1.010–1.025)
Urobilinogen, UA: 0.2 E.U./dL
pH, UA: 7 (ref 5.0–8.0)

## 2020-11-21 MED ORDER — KETOROLAC TROMETHAMINE 60 MG/2ML IM SOLN
60.0000 mg | Freq: Once | INTRAMUSCULAR | Status: AC
  Administered 2020-11-21: 60 mg via INTRAMUSCULAR

## 2020-11-21 MED ORDER — MELOXICAM 15 MG PO TABS: 15.0000 mg | ORAL_TABLET | Freq: Every day | ORAL | 0 refills | Status: DC

## 2020-11-21 MED ORDER — TAMSULOSIN HCL 0.4 MG PO CAPS: 0.4000 mg | ORAL_CAPSULE | Freq: Every day | ORAL | 1 refills | Status: DC

## 2020-11-21 NOTE — Progress Notes (Addendum)
Acute Office Visit  Subjective:    Patient ID: Douglas Shah, male    DOB: [birth year], 60 y.o.   MRN: [id number]  Chief Complaint  Patient presents with  . Groin Pain    60 year old man with history of degenerative disc disease presents with 3 week history of right sided lower back pain/stiffness and 1 week history of a bulge in his right groin with 5/10 pain radiating into his scrotum.  He denies that anything makes the pain better or worse for either injury, but he notices his back pain more when he flexes his right hip.  The bulge in his right hip was noticed by his wife, and he has not noticed a change in size or pain over the last week.  No change noticed with activity.  He reports that he cannot pinpoint an injury or action that led to either of his symptoms, but that he does regularly lift heavy objects at work.  He also states that he has had some hesitancy in urination, and worries about his prostate being enlarged.  Additionally, he had a previous bout of diverticulitis that presented with pain radiating to his back.    Groin Pain The patient's primary symptoms include testicular pain. The patient's pertinent negatives include no penile pain or scrotal swelling. Pertinent negatives include no abdominal pain, chest pain, chills, constipation, coughing, diarrhea, fever, rash or shortness of breath.    Past Medical History:  Diagnosis Date  . Arthritis   . Chronic elbow pain left  . Diverticulitis 04/28/2019   Confirm CT scan ED June 2020  . Sessile colonic polyp 07/20/2019    Past Surgical History:  Procedure Laterality Date  . EXERCISE TREADMILL STRESS TEST  04-19-2008   LOW RISK/ NO ISCHEMIA  . HIP ARTHROSCOPY W/ LABRAL DEBRIDEMENT  03-09-2011   RIGHT HIP  . REPAIR EXTENSOR TENDON  08/15/2012   Procedure: REPAIR EXTENSOR TENDON;  Surgeon: Magnus Sinning, MD;  Location: [facility];  Service: Orthopedics;  Laterality: Left;  WITH PARTIAL LATERAL  EPICONDYLECTOMY OF THE LEFT ELBOW   . RIGHT SHOULDER SURGERY  2000    Family History  Problem Relation Age of Onset  . COPD Mother   . Heart attack Father   . Colon cancer Neg Hx     Social History   Socioeconomic History  . Marital status: Married    Spouse name: Not on file  . Number of children: Not on file  . Years of education: Not on file  . Highest education level: Not on file  Occupational History  . Not on file  Tobacco Use  . Smoking status: Never Smoker  . Smokeless tobacco: Current User    Types: Snuff  Substance and Sexual Activity  . Alcohol use: Yes    Alcohol/week: 3.0 standard drinks    Types: 3 Cans of beer per week    Comment: socially  . Drug use: No  . Sexual activity: Not on file  Other Topics Concern  . Not on file  Social History Narrative  . Not on file   Social Determinants of Health   Financial Resource Strain: Not on file  Food Insecurity: Not on file  Transportation Needs: Not on file  Physical Activity: Not on file  Stress: Not on file  Social Connections: Not on file  Intimate Partner Violence: Not on file    Outpatient Medications Prior to Visit  Medication Sig Dispense Refill  . sildenafil (VIAGRA) 100  MG tablet Take 0.5-1 tablets (50-100 mg total) by mouth daily as needed for erectile dysfunction. 10 tablet 11  . fluticasone (FLONASE) 50 MCG/ACT nasal spray Place into both nostrils daily. (Patient not taking: Reported on 10/03/2020)    . Omega-3 Fatty Acids (FISH OIL) 1000 MG CAPS Take 1,000 mg by mouth 2 (two) times daily. (Patient not taking: Reported on 10/03/2020)     No facility-administered medications prior to visit.    No Known Allergies  Review of Systems  Constitutional: Negative for activity change, appetite change, chills, fatigue and fever.  HENT: Negative for congestion, postnasal drip, rhinorrhea, sinus pressure and sneezing.   Eyes: Negative for redness and itching.  Respiratory: Negative for cough, chest  tightness and shortness of breath.   Cardiovascular: Negative for chest pain and palpitations.  Gastrointestinal: Negative for abdominal distention, abdominal pain, blood in stool, constipation and diarrhea.  Genitourinary: Positive for decreased urine volume and testicular pain. Negative for penile pain and scrotal swelling.       Bulge in right groin  Musculoskeletal: Positive for back pain. Negative for gait problem and neck pain.  Skin: Negative for pallor and rash.  Neurological: Negative for tremors, weakness and numbness.  Psychiatric/Behavioral: Negative for confusion and dysphoric mood. The patient is not nervous/anxious.        Objective:    Physical Exam Exam conducted with a chaperone present.  Constitutional:      General: He is not in acute distress.    Appearance: Normal appearance. He is normal weight. He is not ill-appearing or diaphoretic.  HENT:     Head: Normocephalic.     Nose: Nose normal.  Eyes:     Extraocular Movements: Extraocular movements intact.     Pupils: Pupils are equal, round, and reactive to light.  Cardiovascular:     Rate and Rhythm: Normal rate and regular rhythm.     Pulses: Normal pulses.     Heart sounds: Normal heart sounds. No murmur heard. No friction rub. No gallop.   Pulmonary:     Effort: Pulmonary effort is normal. No respiratory distress.     Breath sounds: Normal breath sounds. No wheezing, rhonchi or rales.  Abdominal:     General: Abdomen is flat. Bowel sounds are normal. There is no distension.     Palpations: Abdomen is soft.     Tenderness: There is no abdominal tenderness. There is no right CVA tenderness, left CVA tenderness or guarding.     Hernia: A hernia is present. Hernia is present in the right inguinal area. There is no hernia in the left inguinal area.    Genitourinary:    Pubic Area: No rash.      Penis: Normal and circumcised.      Testes:        Right: Mass, tenderness or swelling not present.         Left: Mass, tenderness or swelling not present.     Epididymis:     Right: Not inflamed or enlarged. No mass or tenderness.     Left: Not inflamed or enlarged. No mass or tenderness.     Comments: Small bulge palpated in deep inguinal ring on right side.  Musculoskeletal:        General: Tenderness present. No swelling, deformity or signs of injury.       Arms:     Cervical back: Normal range of motion.  Skin:    General: Skin is warm and dry.  Capillary Refill: Capillary refill takes less than 2 seconds.  Neurological:     General: No focal deficit present.     Mental Status: He is alert and oriented to person, place, and time. Mental status is at baseline.  Psychiatric:        Mood and Affect: Mood normal.        Behavior: Behavior normal.     BP 120/81   Pulse 85   Ht 5\' 8"  (1.727 m)   Wt 166 lb (75.3 kg)   SpO2 99%   BMI 25.24 kg/m  Wt Readings from Last 3 Encounters:  11/21/20 166 lb (75.3 kg)  10/03/20 167 lb 14.4 oz (76.2 kg)  07/20/19 165 lb (74.8 kg)         Assessment & Plan:  Marland KitchenMarland KitchenMarwin was seen today for groin pain.  Diagnoses and all orders for this visit:  Acute right-sided low back pain without sciatica -     meloxicam (MOBIC) 15 MG tablet; Take 1 tablet (15 mg total) by mouth daily. -     POCT URINALYSIS DIP (CLINITEK)  Benign prostatic hyperplasia with weak urinary stream -     tamsulosin (FLOMAX) 0.4 MG CAPS capsule; Take 1 capsule (0.4 mg total) by mouth daily after supper.  Right inguinal hernia -     Ambulatory referral to General Surgery  DDD (degenerative disc disease), lumbar -     meloxicam (MOBIC) 15 MG tablet; Take 1 tablet (15 mg total) by mouth daily. -     ketorolac (TORADOL) injection 60 mg  Weak urinary stream -     tamsulosin (FLOMAX) 0.4 MG CAPS capsule; Take 1 capsule (0.4 mg total) by mouth daily after supper. -     POCT URINALYSIS DIP (CLINITEK)   .Marland Kitchen Results for orders placed or performed in visit on 11/21/20  POCT  URINALYSIS DIP (CLINITEK)  Result Value Ref Range   Color, UA yellow yellow   Clarity, UA clear clear   Glucose, UA negative negative mg/dL   Bilirubin, UA negative negative   Ketones, POC UA negative negative mg/dL   Spec Grav, UA 6.431 4.276 - 1.025   Blood, UA negative negative   pH, UA 7.0 5.0 - 8.0   POC PROTEIN,UA negative negative, trace   Urobilinogen, UA 0.2 0.2 or 1.0 E.U./dL   Nitrite, UA Negative Negative   Leukocytes, UA Negative Negative     Inguinal Hernia:  Pelvic examination revealed small herniation noted by palpation of inguinal ring.  Referred to general surgery for evaluation.  Education provided to patient that hernia is very small, but considering his physical work requiring heavy lifting, is likely to worsen over time.  Since he plans to keep working, he was encouraged to make referral apt with surgery.  He was agreeable to this course of action.    Lower back pain:  Explained to patient that considering location, presentation, and health history, this pain is most likely musculoskeletal in origin.  2016 spinal XR shows age indeterminate mild anterior vertebral body compression deformity of the L2 vertebral body.  CVA tenderness negative and UA neg.  Toradol 60 mg injection given in office with plan to start meloxicam 15 mg tab QD PO on 1/25. Discussed low back exercises.  If no improvement, patient instructed to follow up with PCP.    Weak urinary stream:  PSA last checked on 10/03/2020 and was WNL.  UA collected today was WNL.  Since sx are bothersome, patient was offered and accepted  prescription of 0.4 mg Flomax capsul QD PO and to follow up with PCP if worsening of sx.  Patient provided education about prostate enlargement.   Pt was concerned about diverticulitis. He is having no bowel changes, fever, abdominal pain to suggest this.      Marland KitchenVernetta Honey PA-C, have reviewed and agree with the above documentation in it's entirety.

## 2020-11-21 NOTE — Progress Notes (Signed)
Normal urine. No signs of infection or abnormality.

## 2020-11-21 NOTE — Patient Instructions (Addendum)
Low Back Sprain or Strain Rehab Ask your health care provider which exercises are safe for you. Do exercises exactly as told by your health care provider and adjust them as directed. It is normal to feel mild stretching, pulling, tightness, or discomfort as you do these exercises. Stop right away if you feel sudden pain or your pain gets worse. Do not begin these exercises until told by your health care provider. Stretching and range-of-motion exercises These exercises warm up your muscles and joints and improve the movement and flexibility of your back. These exercises also help to relieve pain, numbness, and tingling. Lumbar rotation 1. Lie on your back on a firm surface and bend your knees. 2. Straighten your arms out to your sides so each arm forms a 90-degree angle (right angle) with a side of your body. 3. Slowly move (rotate) both of your knees to one side of your body until you feel a stretch in your lower back (lumbar). Try not to let your shoulders lift off the floor. 4. Hold this position for __________ seconds. 5. Tense your abdominal muscles and slowly move your knees back to the starting position.  Inguinal Hernia, Adult An inguinal hernia is when fat or your intestines push through a weak spot in a muscle where your leg meets your lower belly (groin). This causes a bulge. This kind of hernia could also be: In your scrotum, if you are male. In folds of skin around your vagina, if you are male. There are three types of inguinal hernias: Hernias that can be pushed back into the belly (are reducible). This type rarely causes pain. Hernias that cannot be pushed back into the belly (are incarcerated). Hernias that cannot be pushed back into the belly and lose their blood supply (are strangulated). This type needs emergency surgery. What are the causes? This condition is caused by having a weak spot in the muscles or tissues in your groin. This develops over time. The hernia may poke  through the weak spot when you strain your lower belly muscles all of a sudden, such as when you: Lift a heavy object. Strain to poop (have a bowel movement). Trouble pooping (constipation) can lead to straining. Cough. What increases the risk? This condition is more likely to develop in: Males. Pregnant females. People who: Are overweight. Work in jobs that require long periods of standing or heavy lifting. Have had an inguinal hernia before. Smoke or have lung disease. These factors can lead to long-term (chronic) coughing. What are the signs or symptoms? Symptoms may depend on the size of the hernia. Often, a small hernia has no symptoms. Symptoms of a larger hernia may include: A bulge in the groin area. This is easier to see when standing. You might not be able to see it when you are lying down. Pain or burning in the groin. This may get worse when you lift, strain, or cough. A dull ache or a feeling of pressure in the groin. An abnormal bulge in the scrotum, in males. Symptoms of a strangulated inguinal hernia may include: A bulge in your groin that is very painful and tender to the touch. A bulge that turns red or purple. Fever, feeling like you may vomit (nausea), and vomiting. Not being able to poop or to pass gas. How is this treated? Treatment depends on the size of your hernia and whether you have symptoms. If you do not have symptoms, your doctor may have you watch your hernia carefully and have  you come in for follow-up visits. If your hernia is large or if you have symptoms, you may need surgery to repair the hernia. Follow these instructions at home: Lifestyle Avoid lifting heavy objects. Avoid standing for long amounts of time. Do not smoke or use any products that contain nicotine or tobacco. If you need help quitting, ask your doctor. Stay at a healthy weight. Prevent trouble pooping You may need to take these actions to prevent or treat trouble pooping: Drink  enough fluid to keep your pee (urine) pale yellow. Take over-the-counter or prescription medicines. Eat foods that are high in fiber. These include beans, whole grains, and fresh fruits and vegetables. Limit foods that are high in fat and sugar. These include fried or sweet foods. General instructions You may try to push your hernia back in place by very gently pressing on it when you are lying down. Do not try to push the bulge back in if it will not go in easily. Watch your hernia for any changes in shape, size, or color. Tell your doctor if you see any changes. Take over-the-counter and prescription medicines only as told by your doctor. Keep all follow-up visits. Contact a doctor if: You have a fever or chills. You have new symptoms. Your symptoms get worse. Get help right away if: You have pain in your groin that gets worse all of a sudden. You have a bulge in your groin that: Gets bigger all of a sudden, and it does not get smaller after that. Turns red or purple. Is painful when you touch it. You are a male, and you have: Sudden pain in your scrotum. A sudden change in the size of your scrotum. You cannot push the hernia back in place by very gently pressing on it when you are lying down. You feel like you may vomit, and that feeling does not go away. You keep vomiting. You have a fast heartbeat. You cannot poop or pass gas. These symptoms may be an emergency. Get help right away. Call your local emergency services (911 in the [place] Do not wait to see if the symptoms will go away. Do not drive yourself to the hospital. Summary An inguinal hernia is when fat or your intestines push through a weak spot in a muscle where your leg meets your lower belly (groin). This causes a bulge. If you do not have symptoms, you may not need treatment. If you have symptoms or a large hernia, you may need surgery. Avoid lifting heavy objects. Also, avoid standing for long amounts of time. Do  not try to push the bulge back in if it will not go in easily. This information is not intended to replace advice given to you by your health care provider. Make sure you discuss any questions you have with your health care provider. Document Revised: 06/14/2020 Document Reviewed: 06/14/2020 Elsevier Patient Education  2021 [place]. 6. Repeat this exercise on the other side of your body. Repeat __________ times. Complete this exercise __________ times a day.   Single knee to chest 1. Lie on your back on a firm surface with both legs straight. 2. Bend one of your knees. Use your hands to move your knee up toward your chest until you feel a gentle stretch in your lower back and buttock. ? Hold your leg in this position by holding on to the front of your knee. ? Keep your other leg as straight as possible. 3. Hold this position for __________ seconds.  4. Slowly return to the starting position. 5. Repeat with your other leg. Repeat __________ times. Complete this exercise __________ times a day.   Prone extension on elbows 1. Lie on your abdomen on a firm surface (prone position). 2. Prop yourself up on your elbows. 3. Use your arms to help lift your chest up until you feel a gentle stretch in your abdomen and your lower back. ? This will place some of your body weight on your elbows. If this is uncomfortable, try stacking pillows under your chest. ? Your hips should stay down, against the surface that you are lying on. Keep your hip and back muscles relaxed. 4. Hold this position for __________ seconds. 5. Slowly relax your upper body and return to the starting position. Repeat __________ times. Complete this exercise __________ times a day.   Strengthening exercises These exercises build strength and endurance in your back. Endurance is the ability to use your muscles for a long time, even after they get tired. Pelvic tilt This exercise strengthens the muscles that lie deep in the  abdomen. 1. Lie on your back on a firm surface. Bend your knees and keep your feet flat on the floor. 2. Tense your abdominal muscles. Tip your pelvis up toward the ceiling and flatten your lower back into the floor. ? To help with this exercise, you may place a small towel under your lower back and try to push your back into the towel. 3. Hold this position for __________ seconds. 4. Let your muscles relax completely before you repeat this exercise. Repeat __________ times. Complete this exercise __________ times a day. Alternating arm and leg raises 1. Get on your hands and knees on a firm surface. If you are on a hard floor, you may want to use padding, such as an exercise mat, to cushion your knees. 2. Line up your arms and legs. Your hands should be directly below your shoulders, and your knees should be directly below your hips. 3. Lift your left leg behind you. At the same time, raise your right arm and straighten it in front of you. ? Do not lift your leg higher than your hip. ? Do not lift your arm higher than your shoulder. ? Keep your abdominal and back muscles tight. ? Keep your hips facing the ground. ? Do not arch your back. ? Keep your balance carefully, and do not hold your breath. 4. Hold this position for __________ seconds. 5. Slowly return to the starting position. 6. Repeat with your right leg and your left arm. Repeat __________ times. Complete this exercise __________ times a day.   Abdominal set with straight leg raise 1. Lie on your back on a firm surface. 2. Bend one of your knees and keep your other leg straight. 3. Tense your abdominal muscles and lift your straight leg up, 4-6 inches (10-15 cm) off the ground. 4. Keep your abdominal muscles tight and hold this position for __________ seconds. ? Do not hold your breath. ? Do not arch your back. Keep it flat against the ground. 5. Keep your abdominal muscles tense as you slowly lower your leg back to the  starting position. 6. Repeat with your other leg. Repeat __________ times. Complete this exercise __________ times a day.   Single leg lower with bent knees 1. Lie on your back on a firm surface. 2. Tense your abdominal muscles and lift your feet off the floor, one foot at a time, so your knees and hips  are bent in 90-degree angles (right angles). ? Your knees should be over your hips and your lower legs should be parallel to the floor. 3. Keeping your abdominal muscles tense and your knee bent, slowly lower one of your legs so your toe touches the ground. 4. Lift your leg back up to return to the starting position. ? Do not hold your breath. ? Do not let your back arch. Keep your back flat against the ground. 5. Repeat with your other leg. Repeat __________ times. Complete this exercise __________ times a day. Posture and body mechanics Good posture and healthy body mechanics can help to relieve stress in your body's tissues and joints. Body mechanics refers to the movements and positions of your body while you do your daily activities. Posture is part of body mechanics. Good posture means:  Your spine is in its natural S-curve position (neutral).  Your shoulders are pulled back slightly.  Your head is not tipped forward. Follow these guidelines to improve your posture and body mechanics in your everyday activities. Standing  When standing, keep your spine neutral and your feet about hip width apart. Keep a slight bend in your knees. Your ears, shoulders, and hips should line up.  When you do a task in which you stand in one place for a long time, place one foot up on a stable object that is 2-4 inches (5-10 cm) high, such as a footstool. This helps keep your spine neutral.   Sitting  When sitting, keep your spine neutral and keep your feet flat on the floor. Use a footrest, if necessary, and keep your thighs parallel to the floor. Avoid rounding your shoulders, and avoid tilting your  head forward.  When working at a desk or a computer, keep your desk at a height where your hands are slightly lower than your elbows. Slide your chair under your desk so you are close enough to maintain good posture.  When working at a computer, place your monitor at a height where you are looking straight ahead and you do not have to tilt your head forward or downward to look at the screen.   Resting  When lying down and resting, avoid positions that are most painful for you.  If you have pain with activities such as sitting, bending, stooping, or squatting, lie in a position in which your body does not bend very much. For example, avoid curling up on your side with your arms and knees near your chest (fetal position).  If you have pain with activities such as standing for a long time or reaching with your arms, lie with your spine in a neutral position and bend your knees slightly. Try the following positions: ? Lying on your side with a pillow between your knees. ? Lying on your back with a pillow under your knees. Lifting  When lifting objects, keep your feet at least shoulder width apart and tighten your abdominal muscles.  Bend your knees and hips and keep your spine neutral. It is important to lift using the strength of your legs, not your back. Do not lock your knees straight out.  Always ask for help to lift heavy or awkward objects.   This information is not intended to replace advice given to you by your health care provider. Make sure you discuss any questions you have with your health care provider. Document Revised: 02/06/2019 Document Reviewed: 11/06/2018 Elsevier Patient Education  [place].

## 2020-12-16 ENCOUNTER — Ambulatory Visit: Payer: Self-pay | Admitting: General Surgery

## 2020-12-16 NOTE — H&P (View-Only) (Signed)
Susette Racer Appointment: 12/16/2020 3:00 PM Location: [facility] Patient #: [id number] DOB: 1961/04/26 Married / Language: Cleophus Molt / Race: White Male  History of Present Illness Randall Hiss M. Kirt Chew MD; 12/16/2020 3:30 PM) The patient is a 60 year old male who presents with an inguinal hernia. He is referred by Iran Planas PA for evaluation of a right inguinal hernia. He states that he started noticing some discomfort after lifting heavy objects at work. He then noticed of bulge in his groin. He states that sometimes the ache will radiate down to his testicle. It is more noticeable in the evening. Generally the bulge is not there when he wakes up. No prior abdominal surgery. No cardiac history. No chest pain, source of breath, orthopnea, TIAs or amaurosis fugax. No tobacco. He does have some BPH. He was started on Flomax.   Problem List/Past Medical Randall Hiss M. Redmond Pulling, MD; 12/16/2020 3:30 PM) BPH ASSOCIATED WITH NOCTURIA (N40.1) RIGHT INGUINAL HERNIA (K40.90)  Past Surgical History Sharyn Lull R. Brooks, CMA; 12/16/2020 2:57 PM) Hip Surgery Right. Shoulder Surgery Right.  Diagnostic Studies History Sharyn Lull R. Rolena Infante, CMA; 12/16/2020 2:57 PM) Colonoscopy 1-5 years ago  Allergies Sharyn Lull R. Brooks, CMA; 12/16/2020 2:57 PM) No Known Drug Allergies [12/16/2020]:  Medication History Sharyn Lull R. Rolena Infante, CMA; 12/16/2020 2:57 PM) No Current Medications Medications Reconciled  Social History Sharyn Lull R. Brooks, CMA; 12/16/2020 2:57 PM) Alcohol use Occasional alcohol use. Caffeine use Carbonated beverages, Coffee. No drug use Tobacco use Never smoker.  Family History Sharyn Lull R. Brooks, CMA; 12/16/2020 2:57 PM) Heart Disease Father. Heart disease in male family member before age 11  Other Problems [name]. Redmond Pulling, MD; 12/16/2020 3:30 PM) No pertinent past medical history     Review of Systems ([place]. Brooks CMA; 12/16/2020 2:57 PM) General Not  Present- Appetite Loss, Chills, Fatigue, Fever, Night Sweats, Weight Gain and Weight Loss. Skin Not Present- Change in Wart/Mole, Dryness, Hives, Jaundice, New Lesions, Non-Healing Wounds, Rash and Ulcer. HEENT Not Present- Earache, Hearing Loss, Hoarseness, Nose Bleed, Oral Ulcers, Ringing in the Ears, Seasonal Allergies, Sinus Pain, Sore Throat, Visual Disturbances, Wears glasses/contact lenses and Yellow Eyes. Respiratory Not Present- Bloody sputum, Chronic Cough, Difficulty Breathing, Snoring and Wheezing. Breast Not Present- Breast Mass, Breast Pain, Nipple Discharge and Skin Changes. Cardiovascular Not Present- Chest Pain, Difficulty Breathing Lying Down, Leg Cramps, Palpitations, Rapid Heart Rate, Shortness of Breath and Swelling of Extremities. Gastrointestinal Not Present- Abdominal Pain, Bloating, Bloody Stool, Change in Bowel Habits, Chronic diarrhea, Constipation, Difficulty Swallowing, Excessive gas, Gets full quickly at meals, Hemorrhoids, Indigestion, Nausea, Rectal Pain and Vomiting. Male Genitourinary Not Present- Blood in Urine, Change in Urinary Stream, Frequency, Impotence, Nocturia, Painful Urination, Urgency and Urine Leakage. Musculoskeletal Not Present- Back Pain, Joint Pain, Joint Stiffness, Muscle Pain, Muscle Weakness and Swelling of Extremities. Neurological Not Present- Decreased Memory, Fainting, Headaches, Numbness, Seizures, Tingling, Tremor, Trouble walking and Weakness. Psychiatric Not Present- Anxiety, Bipolar, Change in Sleep Pattern, Depression, Fearful and Frequent crying. Endocrine Not Present- Cold Intolerance, Excessive Hunger, Hair Changes, Heat Intolerance, Hot flashes and New Diabetes. Hematology Not Present- Blood Thinners, Easy Bruising, Excessive bleeding, Gland problems, HIV and Persistent Infections.  Vitals [employer] R. Brooks CMA; 12/16/2020 2:57 PM) 12/16/2020 2:57 PM Weight: 165.25 lb Height: 68in Body Surface Area: 1.88 m Body Mass Index:  25.13 kg/m  Temp.: 98.54F(Oral)  Pulse: 87 (Regular)  BP: 112/76(Sitting, Left Arm, Standard)        Physical Exam Randall Hiss M. Jalaila Caradonna MD; 12/16/2020 3:29 PM)  General Mental Status-Alert.  General Appearance-Consistent with stated age. Hydration-Well hydrated. Voice-Normal.  Head and Neck Head-normocephalic, atraumatic with no lesions or palpable masses. Trachea-midline. Thyroid Gland Characteristics - normal size and consistency.  Eye Eyeball - Bilateral-Extraocular movements intact. Sclera/Conjunctiva - Bilateral-No scleral icterus.  Chest and Lung Exam Chest and lung exam reveals -quiet, even and easy respiratory effort with no use of accessory muscles and on auscultation, normal breath sounds, no adventitious sounds and normal vocal resonance. Inspection Chest Wall - Normal. Back - normal.  Breast - Did not examine.  Cardiovascular Cardiovascular examination reveals -normal heart sounds, regular rate and rhythm with no murmurs and normal pedal pulses bilaterally.  Abdomen Inspection Inspection of the abdomen reveals - No Hernias. Skin - Scar - no surgical scars. Palpation/Percussion Palpation and Percussion of the abdomen reveal - Soft, Non Tender, No Rebound tenderness, No Rigidity (guarding) and No hepatosplenomegaly. Auscultation Auscultation of the abdomen reveals - Bowel sounds normal.  Male Genitourinary Note: Both testicles descended. Small bulge in right groin. Easily reducible. No bulge in left groin with Valsalva  Peripheral Vascular Upper Extremity Palpation - Pulses bilaterally normal.  Neurologic Neurologic evaluation reveals -alert and oriented x 3 with no impairment of recent or remote memory. Mental Status-Normal.  Neuropsychiatric The patient's mood and affect are described as -normal. Judgment and Insight-insight is appropriate concerning matters relevant to self.  Musculoskeletal Normal Exam -  Left-Upper Extremity Strength Normal and Lower Extremity Strength Normal. Normal Exam - Right-Upper Extremity Strength Normal and Lower Extremity Strength Normal.  Lymphatic Head & Neck  General Head & Neck Lymphatics: Bilateral - Description - Normal. Axillary - Did not examine. Femoral & Inguinal - Did not examine.    Assessment & Plan Randall Hiss M. Kareen Jefferys MD; 12/16/2020 3:29 PM)  RIGHT INGUINAL HERNIA (K40.90) Impression: We discussed the etiology of inguinal hernias. We discussed the signs & symptoms of incarceration & strangulation. We discussed non-operative and operative management. we discussed open vs robotic/MIS and pros/cons of each  The patient has elected to proceed with robotic repair of Right inguinal hernia with mesh   I described the procedure in detail. The patient was given educational material. We discussed the risks and benefits including but not limited to bleeding, infection, chronic inguinal pain, nerve entrapment, hernia recurrence, mesh complications, hematoma formation, urinary retention, injury to the testicles or the ovaries, numbness in the groin, blood clots, injury to the surrounding structures, and anesthesia risk. We also discussed the typical post operative recovery course, including no heavy lifting for 4-6 weeks. I explained that the likelihood of improvement of their symptoms is good  This patient encounter took 33 minutes today to perform the following: take history, perform exam, review outside records, interpret imaging, counsel the patient on their diagnosis and document encounter, findings & plan in the EHR  Current Plans Pt Education - Pamphlet Given - Laparoscopic Hernia Repair: discussed with patient and provided information. You are being scheduled for surgery- Our schedulers will call you.  You should hear from our office's scheduling department within 5 working days about the location, date, and time of surgery. We try to make  accommodations for patient's preferences in scheduling surgery, but sometimes the OR schedule or the surgeon's schedule prevents [place] from making those accommodations.  If you have not heard from our office [phone]) in 5 working days, call the office and ask for your surgeon's nurse.  If you have other questions about your diagnosis, plan, or surgery, call the office and ask for your surgeon's nurse.  take flomax starting about 2 weeks before surgery   BPH ASSOCIATED WITH NOCTURIA (N40.1) Impression: Encouraged patient to continue Flomax perioperatively to try to decrease his risk of urinary retention  [name]. Redmond Pulling, MD, FACS General, Bariatric, & Minimally Invasive Surgery [facility], [place]

## 2020-12-16 NOTE — H&P (Signed)
Douglas Shah Appointment: 12/16/2020 3:00 PM Location: [facility] Patient #: [id number] DOB: 04/02/1961 Married / Language: Cleophus Molt / Race: White Male  History of Present Illness Randall Hiss M. Sterling Mondo MD; 12/16/2020 3:30 PM) The patient is a 60 year old male who presents with an inguinal hernia. He is referred by Iran Planas PA for evaluation of a right inguinal hernia. He states that he started noticing some discomfort after lifting heavy objects at work. He then noticed of bulge in his groin. He states that sometimes the ache will radiate down to his testicle. It is more noticeable in the evening. Generally the bulge is not there when he wakes up. No prior abdominal surgery. No cardiac history. No chest pain, source of breath, orthopnea, TIAs or amaurosis fugax. No tobacco. He does have some BPH. He was started on Flomax.   Problem List/Past Medical Randall Hiss M. Redmond Pulling, MD; 12/16/2020 3:30 PM) BPH ASSOCIATED WITH NOCTURIA (N40.1) RIGHT INGUINAL HERNIA (K40.90)  Past Surgical History Sharyn Lull R. Brooks, CMA; 12/16/2020 2:57 PM) Hip Surgery Right. Shoulder Surgery Right.  Diagnostic Studies History Sharyn Lull R. Rolena Infante, CMA; 12/16/2020 2:57 PM) Colonoscopy 1-5 years ago  Allergies Sharyn Lull R. Brooks, CMA; 12/16/2020 2:57 PM) No Known Drug Allergies [12/16/2020]:  Medication History Sharyn Lull R. Rolena Infante, CMA; 12/16/2020 2:57 PM) No Current Medications Medications Reconciled  Social History Sharyn Lull R. Brooks, CMA; 12/16/2020 2:57 PM) Alcohol use Occasional alcohol use. Caffeine use Carbonated beverages, Coffee. No drug use Tobacco use Never smoker.  Family History Sharyn Lull R. Brooks, CMA; 12/16/2020 2:57 PM) Heart Disease Father. Heart disease in male family member before age 77  Other Problems [name]. Redmond Pulling, MD; 12/16/2020 3:30 PM) No pertinent past medical history     Review of Systems ([place]. Brooks CMA; 12/16/2020 2:57 PM) General Not  Present- Appetite Loss, Chills, Fatigue, Fever, Night Sweats, Weight Gain and Weight Loss. Skin Not Present- Change in Wart/Mole, Dryness, Hives, Jaundice, New Lesions, Non-Healing Wounds, Rash and Ulcer. HEENT Not Present- Earache, Hearing Loss, Hoarseness, Nose Bleed, Oral Ulcers, Ringing in the Ears, Seasonal Allergies, Sinus Pain, Sore Throat, Visual Disturbances, Wears glasses/contact lenses and Yellow Eyes. Respiratory Not Present- Bloody sputum, Chronic Cough, Difficulty Breathing, Snoring and Wheezing. Breast Not Present- Breast Mass, Breast Pain, Nipple Discharge and Skin Changes. Cardiovascular Not Present- Chest Pain, Difficulty Breathing Lying Down, Leg Cramps, Palpitations, Rapid Heart Rate, Shortness of Breath and Swelling of Extremities. Gastrointestinal Not Present- Abdominal Pain, Bloating, Bloody Stool, Change in Bowel Habits, Chronic diarrhea, Constipation, Difficulty Swallowing, Excessive gas, Gets full quickly at meals, Hemorrhoids, Indigestion, Nausea, Rectal Pain and Vomiting. Male Genitourinary Not Present- Blood in Urine, Change in Urinary Stream, Frequency, Impotence, Nocturia, Painful Urination, Urgency and Urine Leakage. Musculoskeletal Not Present- Back Pain, Joint Pain, Joint Stiffness, Muscle Pain, Muscle Weakness and Swelling of Extremities. Neurological Not Present- Decreased Memory, Fainting, Headaches, Numbness, Seizures, Tingling, Tremor, Trouble walking and Weakness. Psychiatric Not Present- Anxiety, Bipolar, Change in Sleep Pattern, Depression, Fearful and Frequent crying. Endocrine Not Present- Cold Intolerance, Excessive Hunger, Hair Changes, Heat Intolerance, Hot flashes and New Diabetes. Hematology Not Present- Blood Thinners, Easy Bruising, Excessive bleeding, Gland problems, HIV and Persistent Infections.  Vitals [employer] R. Brooks CMA; 12/16/2020 2:57 PM) 12/16/2020 2:57 PM Weight: 165.25 lb Height: 68in Body Surface Area: 1.88 m Body Mass Index:  25.13 kg/m  Temp.: 98.84F(Oral)  Pulse: 87 (Regular)  BP: 112/76(Sitting, Left Arm, Standard)        Physical Exam Randall Hiss M. Tanvir Hipple MD; 12/16/2020 3:29 PM)  General Mental Status-Alert.  General Appearance-Consistent with stated age. Hydration-Well hydrated. Voice-Normal.  Head and Neck Head-normocephalic, atraumatic with no lesions or palpable masses. Trachea-midline. Thyroid Gland Characteristics - normal size and consistency.  Eye Eyeball - Bilateral-Extraocular movements intact. Sclera/Conjunctiva - Bilateral-No scleral icterus.  Chest and Lung Exam Chest and lung exam reveals -quiet, even and easy respiratory effort with no use of accessory muscles and on auscultation, normal breath sounds, no adventitious sounds and normal vocal resonance. Inspection Chest Wall - Normal. Back - normal.  Breast - Did not examine.  Cardiovascular Cardiovascular examination reveals -normal heart sounds, regular rate and rhythm with no murmurs and normal pedal pulses bilaterally.  Abdomen Inspection Inspection of the abdomen reveals - No Hernias. Skin - Scar - no surgical scars. Palpation/Percussion Palpation and Percussion of the abdomen reveal - Soft, Non Tender, No Rebound tenderness, No Rigidity (guarding) and No hepatosplenomegaly. Auscultation Auscultation of the abdomen reveals - Bowel sounds normal.  Male Genitourinary Note: Both testicles descended. Small bulge in right groin. Easily reducible. No bulge in left groin with Valsalva  Peripheral Vascular Upper Extremity Palpation - Pulses bilaterally normal.  Neurologic Neurologic evaluation reveals -alert and oriented x 3 with no impairment of recent or remote memory. Mental Status-Normal.  Neuropsychiatric The patient's mood and affect are described as -normal. Judgment and Insight-insight is appropriate concerning matters relevant to self.  Musculoskeletal Normal Exam -  Left-Upper Extremity Strength Normal and Lower Extremity Strength Normal. Normal Exam - Right-Upper Extremity Strength Normal and Lower Extremity Strength Normal.  Lymphatic Head & Neck  General Head & Neck Lymphatics: Bilateral - Description - Normal. Axillary - Did not examine. Femoral & Inguinal - Did not examine.    Assessment & Plan Randall Hiss M. Blayne Garlick MD; 12/16/2020 3:29 PM)  RIGHT INGUINAL HERNIA (K40.90) Impression: We discussed the etiology of inguinal hernias. We discussed the signs & symptoms of incarceration & strangulation. We discussed non-operative and operative management. we discussed open vs robotic/MIS and pros/cons of each  The patient has elected to proceed with robotic repair of Right inguinal hernia with mesh   I described the procedure in detail. The patient was given educational material. We discussed the risks and benefits including but not limited to bleeding, infection, chronic inguinal pain, nerve entrapment, hernia recurrence, mesh complications, hematoma formation, urinary retention, injury to the testicles or the ovaries, numbness in the groin, blood clots, injury to the surrounding structures, and anesthesia risk. We also discussed the typical post operative recovery course, including no heavy lifting for 4-6 weeks. I explained that the likelihood of improvement of their symptoms is good  This patient encounter took 33 minutes today to perform the following: take history, perform exam, review outside records, interpret imaging, counsel the patient on their diagnosis and document encounter, findings & plan in the EHR  Current Plans Pt Education - Pamphlet Given - Laparoscopic Hernia Repair: discussed with patient and provided information. You are being scheduled for surgery- Our schedulers will call you.  You should hear from our office's scheduling department within 5 working days about the location, date, and time of surgery. We try to make  accommodations for patient's preferences in scheduling surgery, but sometimes the OR schedule or the surgeon's schedule prevents [place] from making those accommodations.  If you have not heard from our office [phone]) in 5 working days, call the office and ask for your surgeon's nurse.  If you have other questions about your diagnosis, plan, or surgery, call the office and ask for your surgeon's nurse.  take flomax starting about 2 weeks before surgery   BPH ASSOCIATED WITH NOCTURIA (N40.1) Impression: Encouraged patient to continue Flomax perioperatively to try to decrease his risk of urinary retention  [name]. Redmond Pulling, MD, FACS General, Bariatric, & Minimally Invasive Surgery [facility], [place]

## 2020-12-19 ENCOUNTER — Ambulatory Visit: Payer: 59 | Admitting: Family Medicine

## 2020-12-27 NOTE — Progress Notes (Signed)
DUE TO COVID-19 ONLY ONE VISITOR IS ALLOWED TO COME WITH YOU AND STAY IN THE WAITING ROOM ONLY DURING PRE OP AND PROCEDURE DAY OF SURGERY. THE 1 VISITOR  MAY VISIT WITH YOU AFTER SURGERY IN YOUR PRIVATE ROOM DURING VISITING HOURS ONLY!  YOU NEED TO HAVE A COVID 19 TEST ON__3/12/2020 _____ @__0900am  _____, THIS TEST MUST BE DONE BEFORE SURGERY,  COVID TESTING SITE [address] [place] [address], IT IS ON THE RIGHT GOING OUT [address] APPROXIMATELY  2 MINUTES PAST [employer] ON THE RIGHT. ONCE YOUR COVID TEST IS COMPLETED,  PLEASE BEGIN THE QUARANTINE INSTRUCTIONS AS OUTLINED IN YOUR HANDOUT.                Douglas Shah  12/27/2020   Your procedure is scheduled on: 01/02/2021    Report to [facility] Main  Entrance   Report to admitting at   1145 AM     Call this number if you have problems the morning of surgery [phone]    REMEMBER: NO  SOLID FOOD CANDY OR GUM AFTER MIDNIGHT. CLEAR LIQUIDS UNTIL 1045am         . NOTHING BY MOUTH EXCEPT CLEAR LIQUIDS UNTIL    . PLEASE FINISH ENSURE DRINK PER SURGEON ORDER  WHICH NEEDS TO BE COMPLETED AT   1045am    .      CLEAR LIQUID DIET   Foods Allowed                                                                    Coffee and tea, regular and decaf                            Fruit ices (not with fruit pulp)                                      Iced Popsicles                                    Carbonated beverages, regular and diet                                    Cranberry, grape and apple juices Sports drinks like Gatorade Lightly seasoned clear broth or consume(fat free) Sugar, honey syrup ___________________________________________________________________      BRUSH YOUR TEETH MORNING OF SURGERY AND RINSE YOUR MOUTH OUT, NO CHEWING GUM CANDY OR MINTS.     Take these medicines the morning of surgery with A SIP OF WATER: none   DO NOT TAKE ANY DIABETIC MEDICATIONS DAY OF YOUR SURGERY                                You may not have any metal on your body including hair pins and              piercings  Do not  wear jewelry, make-up, lotions, powders or perfumes, deodorant             Do not wear nail polish on your fingernails.  Do not shave  48 hours prior to surgery.              Men may shave face and neck.   Do not bring valuables to the hospital. [facility].  Contacts, dentures or bridgework may not be worn into surgery.  Leave suitcase in the car. After surgery it may be brought to your room.     Patients discharged the day of surgery will not be allowed to drive home. IF YOU ARE HAVING SURGERY AND GOING HOME THE SAME DAY, YOU MUST HAVE AN ADULT TO DRIVE YOU HOME AND BE WITH YOU FOR 24 HOURS. YOU MAY GO HOME BY TAXI OR UBER OR ORTHERWISE, BUT AN ADULT MUST ACCOMPANY YOU HOME AND STAY WITH YOU FOR 24 HOURS.  Name and phone number of your driver:  Special Instructions: N/A              Please read over the following fact sheets you were given: _____________________________________________________________________  [facility] - Preparing for Surgery Before surgery, you can play an important role.  Because skin is not sterile, your skin needs to be as free of germs as possible.  You can reduce the number of germs on your skin by washing with CHG (chlorahexidine gluconate) soap before surgery.  CHG is an antiseptic cleaner which kills germs and bonds with the skin to continue killing germs even after washing. Please DO NOT use if you have an allergy to CHG or antibacterial soaps.  If your skin becomes reddened/irritated stop using the CHG and inform your nurse when you arrive at Short Stay. Do not shave (including legs and underarms) for at least 48 hours prior to the first CHG shower.  You may shave your face/neck. Please follow these instructions carefully:  1.  Shower with CHG Soap the night before surgery and the  morning of  Surgery.  2.  If you choose to wash your hair, wash your hair first as usual with your  normal  shampoo.  3.  After you shampoo, rinse your hair and body thoroughly to remove the  shampoo.                           4.  Use CHG as you would any other liquid soap.  You can apply chg directly  to the skin and wash                       Gently with a scrungie or clean washcloth.  5.  Apply the CHG Soap to your body ONLY FROM THE NECK DOWN.   Do not use on face/ open                           Wound or open sores. Avoid contact with eyes, ears mouth and genitals (private parts).                       Wash face,  Genitals (private parts) with your normal soap.             6.  Wash thoroughly, paying  special attention to the area where your surgery  will be performed.  7.  Thoroughly rinse your body with warm water from the neck down.  8.  DO NOT shower/wash with your normal soap after using and rinsing off  the CHG Soap.                9.  Pat yourself dry with a clean towel.            10.  Wear clean pajamas.            11.  Place clean sheets on your bed the night of your first shower and do not  sleep with pets. Day of Surgery : Do not apply any lotions/deodorants the morning of surgery.  Please wear clean clothes to the hospital/surgery center.  FAILURE TO FOLLOW THESE INSTRUCTIONS MAY RESULT IN THE CANCELLATION OF YOUR SURGERY PATIENT SIGNATURE_________________________________  NURSE SIGNATURE__________________________________  ________________________________________________________________________

## 2020-12-29 ENCOUNTER — Encounter (HOSPITAL_COMMUNITY): Payer: Self-pay

## 2020-12-29 ENCOUNTER — Encounter (HOSPITAL_COMMUNITY)
Admission: RE | Admit: 2020-12-29 | Discharge: 2020-12-29 | Disposition: A | Discharge status: Home / Self Care | Payer: 59 | Source: Ambulatory Visit | Attending: General Surgery | Admitting: General Surgery

## 2020-12-29 ENCOUNTER — Other Ambulatory Visit: Payer: Self-pay

## 2020-12-29 ENCOUNTER — Other Ambulatory Visit (HOSPITAL_COMMUNITY)
Admission: RE | Admit: 2020-12-29 | Discharge: 2020-12-29 | Disposition: A | Discharge status: Home / Self Care | Payer: 59 | Source: Ambulatory Visit | Attending: General Surgery | Admitting: General Surgery

## 2020-12-29 DIAGNOSIS — Z20822 Contact with and (suspected) exposure to covid-19: Secondary | ICD-10-CM | POA: Insufficient documentation

## 2020-12-29 DIAGNOSIS — Z01812 Encounter for preprocedural laboratory examination: Secondary | ICD-10-CM | POA: Insufficient documentation

## 2020-12-29 LAB — CBC WITH DIFFERENTIAL/PLATELET
Abs Immature Granulocytes: 0.01 10*3/uL (ref 0.00–0.07)
Basophils Absolute: 0 10*3/uL (ref 0.0–0.1)
Basophils Relative: 1 %
Eosinophils Absolute: 0.2 10*3/uL (ref 0.0–0.5)
Eosinophils Relative: 4 %
HCT: 40.1 % (ref 39.0–52.0)
Hemoglobin: 13.6 g/dL (ref 13.0–17.0)
Immature Granulocytes: 0 %
Lymphocytes Relative: 24 %
Lymphs Abs: 1.2 10*3/uL (ref 0.7–4.0)
MCH: 31 pg (ref 26.0–34.0)
MCHC: 33.9 g/dL (ref 30.0–36.0)
MCV: 91.3 fL (ref 80.0–100.0)
Monocytes Absolute: 0.7 10*3/uL (ref 0.1–1.0)
Monocytes Relative: 13 %
Neutro Abs: 2.9 10*3/uL (ref 1.7–7.7)
Neutrophils Relative %: 58 %
Platelets: 214 10*3/uL (ref 150–400)
RBC: 4.39 MIL/uL (ref 4.22–5.81)
RDW: 11.9 % (ref 11.5–15.5)
WBC: 5 10*3/uL (ref 4.0–10.5)
nRBC: 0 % (ref 0.0–0.2)

## 2020-12-29 LAB — BASIC METABOLIC PANEL
Anion gap: 8 (ref 5–15)
BUN: 11 mg/dL (ref 6–20)
CO2: 24 mmol/L (ref 22–32)
Calcium: 9.3 mg/dL (ref 8.9–10.3)
Chloride: 100 mmol/L (ref 98–111)
Creatinine, Ser: 0.89 mg/dL (ref 0.61–1.24)
GFR, Estimated: >60 mL/min (ref >60)
Glucose, Bld: 102 mg/dL — ABNORMAL HIGH (ref 70–99)
Potassium: 4.9 mmol/L (ref 3.5–5.1)
Sodium: 132 mmol/L — ABNORMAL LOW (ref 135–145)

## 2020-12-29 LAB — SARS CORONAVIRUS 2 (TAT 6-24 HRS): SARS Coronavirus 2: NEGATIVE

## 2020-12-29 NOTE — Progress Notes (Signed)
Anesthesia Review:  [facility]  Cardiologist : Chest x-ray : EKG : Echo : Stress test: Cardiac Cath :  Activity level: can climb a flight of stairs without difficulty  Sleep Study/ CPAP : Fasting Blood Sugar :      / Checks Blood Sugar -- times a day:   Blood Thinner/ Instructions /Last Dose: ASA / Instructions/ Last Dose :

## 2021-01-02 ENCOUNTER — Encounter (HOSPITAL_COMMUNITY)
Admission: RE | Disposition: A | Discharge status: Home / Self Care | Payer: Self-pay | Source: Home / Self Care | Attending: General Surgery

## 2021-01-02 ENCOUNTER — Encounter (HOSPITAL_COMMUNITY): Payer: Self-pay | Admitting: General Surgery

## 2021-01-02 ENCOUNTER — Ambulatory Visit (HOSPITAL_COMMUNITY): Payer: 59 | Admitting: Anesthesiology

## 2021-01-02 ENCOUNTER — Ambulatory Visit (HOSPITAL_COMMUNITY)
Admission: RE | Admit: 2021-01-02 | Discharge: 2021-01-02 | Disposition: A | Discharge status: Home / Self Care | Payer: 59 | Attending: General Surgery | Admitting: General Surgery

## 2021-01-02 DIAGNOSIS — Z8249 Family history of ischemic heart disease and other diseases of the circulatory system: Secondary | ICD-10-CM | POA: Diagnosis not present

## 2021-01-02 DIAGNOSIS — K4090 Unilateral inguinal hernia, without obstruction or gangrene, not specified as recurrent: Secondary | ICD-10-CM | POA: Insufficient documentation

## 2021-01-02 DIAGNOSIS — N401 Enlarged prostate with lower urinary tract symptoms: Secondary | ICD-10-CM | POA: Insufficient documentation

## 2021-01-02 HISTORY — PX: XI ROBOTIC ASSISTED INGUINAL HERNIA REPAIR WITH MESH: SHX6706

## 2021-01-02 SURGERY — REPAIR, HERNIA, INGUINAL, ROBOT-ASSISTED, LAPAROSCOPIC, USING MESH
Anesthesia: General | Laterality: Right

## 2021-01-02 MED ORDER — 0.9 % SODIUM CHLORIDE (POUR BTL) OPTIME
TOPICAL | Status: DC | PRN
  Administered 2021-01-02: 1000 mL

## 2021-01-02 MED ORDER — SUGAMMADEX SODIUM 200 MG/2ML IV SOLN
INTRAVENOUS | Status: DC | PRN
  Administered 2021-01-02: 300 mg via INTRAVENOUS

## 2021-01-02 MED ORDER — DEXMEDETOMIDINE (PRECEDEX) IN NS 20 MCG/5ML (4 MCG/ML) IV SYRINGE
PREFILLED_SYRINGE | INTRAVENOUS | Status: DC | PRN
  Administered 2021-01-02: 10 ug via INTRAVENOUS

## 2021-01-02 MED ORDER — KETOROLAC TROMETHAMINE 30 MG/ML IJ SOLN
INTRAMUSCULAR | Status: AC
  Administered 2021-01-02: 15 mg via INTRAVENOUS
  Filled 2021-01-02: qty 1

## 2021-01-02 MED ORDER — DEXAMETHASONE SODIUM PHOSPHATE 10 MG/ML IJ SOLN
INTRAMUSCULAR | Status: DC | PRN
  Administered 2021-01-02: 8 mg via INTRAVENOUS

## 2021-01-02 MED ORDER — OXYCODONE HCL 5 MG PO TABS: 5.0000 mg | ORAL_TABLET | Freq: Four times a day (QID) | ORAL | 0 refills | Status: DC | PRN

## 2021-01-02 MED ORDER — GABAPENTIN 300 MG PO CAPS
ORAL_CAPSULE | ORAL | Status: AC
  Administered 2021-01-02: 200 mg via ORAL
  Filled 2021-01-02: qty 1

## 2021-01-02 MED ORDER — ACETAMINOPHEN 500 MG PO TABS: 1000.0000 mg | ORAL_TABLET | Freq: Three times a day (TID) | ORAL | 0 refills | Status: AC

## 2021-01-02 MED ORDER — ACETAMINOPHEN 500 MG PO TABS
ORAL_TABLET | ORAL | Status: AC
  Administered 2021-01-02: 1000 mg via ORAL
  Filled 2021-01-02: qty 2

## 2021-01-02 MED ORDER — CHLORHEXIDINE GLUCONATE CLOTH 2 % EX PADS: 6.0000 | MEDICATED_PAD | Freq: Once | CUTANEOUS | Status: DC

## 2021-01-02 MED ORDER — PHENYLEPHRINE 40 MCG/ML (10ML) SYRINGE FOR IV PUSH (FOR BLOOD PRESSURE SUPPORT)
PREFILLED_SYRINGE | INTRAVENOUS | Status: AC
  Filled 2021-01-02: qty 10

## 2021-01-02 MED ORDER — GABAPENTIN 100 MG PO CAPS
200.0000 mg | ORAL_CAPSULE | ORAL | Status: AC
  Filled 2021-01-02: qty 2

## 2021-01-02 MED ORDER — MIDAZOLAM HCL 2 MG/2ML IJ SOLN
INTRAMUSCULAR | Status: AC
  Filled 2021-01-02: qty 2

## 2021-01-02 MED ORDER — ACETAMINOPHEN 500 MG PO TABS: 1000.0000 mg | ORAL_TABLET | ORAL | Status: DC

## 2021-01-02 MED ORDER — ONDANSETRON HCL 4 MG/2ML IJ SOLN: 4.0000 mg | Freq: Once | INTRAMUSCULAR | Status: DC | PRN

## 2021-01-02 MED ORDER — ACETAMINOPHEN 500 MG PO TABS: 1000.0000 mg | ORAL_TABLET | Freq: Once | ORAL | Status: DC

## 2021-01-02 MED ORDER — OXYCODONE HCL 5 MG PO TABS: 5.0000 mg | ORAL_TABLET | Freq: Once | ORAL | Status: DC | PRN

## 2021-01-02 MED ORDER — MIDAZOLAM HCL 5 MG/5ML IJ SOLN
INTRAMUSCULAR | Status: DC | PRN
  Administered 2021-01-02: 2 mg via INTRAVENOUS

## 2021-01-02 MED ORDER — LIDOCAINE 2% (20 MG/ML) 5 ML SYRINGE
INTRAMUSCULAR | Status: AC
  Filled 2021-01-02: qty 5

## 2021-01-02 MED ORDER — OXYCODONE HCL 5 MG/5ML PO SOLN
5.0000 mg | Freq: Once | ORAL | Status: DC | PRN
Start: 2021-01-02 — End: 2021-01-02

## 2021-01-02 MED ORDER — LIDOCAINE HCL (CARDIAC) PF 100 MG/5ML IV SOSY
PREFILLED_SYRINGE | INTRAVENOUS | Status: DC | PRN
  Administered 2021-01-02: 60 mg via INTRAVENOUS

## 2021-01-02 MED ORDER — KETOROLAC TROMETHAMINE 30 MG/ML IJ SOLN: 15.0000 mg | Freq: Once | INTRAMUSCULAR | Status: AC | PRN

## 2021-01-02 MED ORDER — LACTATED RINGERS IV SOLN: INTRAVENOUS | Status: DC

## 2021-01-02 MED ORDER — CEFAZOLIN SODIUM-DEXTROSE 2-4 GM/100ML-% IV SOLN
2.0000 g | INTRAVENOUS | Status: AC
  Administered 2021-01-02: 2 g via INTRAVENOUS

## 2021-01-02 MED ORDER — EPHEDRINE SULFATE-NACL 50-0.9 MG/10ML-% IV SOSY
PREFILLED_SYRINGE | INTRAVENOUS | Status: DC | PRN
  Administered 2021-01-02: 15 mg via INTRAVENOUS

## 2021-01-02 MED ORDER — ONDANSETRON HCL 4 MG/2ML IJ SOLN
INTRAMUSCULAR | Status: AC
  Filled 2021-01-02: qty 2

## 2021-01-02 MED ORDER — EPHEDRINE 5 MG/ML INJ
INTRAVENOUS | Status: AC
  Filled 2021-01-02: qty 10

## 2021-01-02 MED ORDER — DEXMEDETOMIDINE (PRECEDEX) IN NS 20 MCG/5ML (4 MCG/ML) IV SYRINGE
PREFILLED_SYRINGE | INTRAVENOUS | Status: AC
  Filled 2021-01-02: qty 5

## 2021-01-02 MED ORDER — ROCURONIUM BROMIDE 10 MG/ML (PF) SYRINGE
PREFILLED_SYRINGE | INTRAVENOUS | Status: AC
  Filled 2021-01-02: qty 10

## 2021-01-02 MED ORDER — HYDROMORPHONE HCL 1 MG/ML IJ SOLN: 0.2500 mg | INTRAMUSCULAR | Status: DC | PRN

## 2021-01-02 MED ORDER — ROCURONIUM BROMIDE 100 MG/10ML IV SOLN
INTRAVENOUS | Status: DC | PRN
  Administered 2021-01-02: 100 mg via INTRAVENOUS
  Administered 2021-01-02: 20 mg via INTRAVENOUS

## 2021-01-02 MED ORDER — FENTANYL CITRATE (PF) 100 MCG/2ML IJ SOLN
INTRAMUSCULAR | Status: AC
  Filled 2021-01-02: qty 2

## 2021-01-02 MED ORDER — BUPIVACAINE-EPINEPHRINE (PF) 0.25% -1:200000 IJ SOLN
INTRAMUSCULAR | Status: AC
  Filled 2021-01-02: qty 30

## 2021-01-02 MED ORDER — CEFAZOLIN SODIUM-DEXTROSE 2-4 GM/100ML-% IV SOLN
INTRAVENOUS | Status: AC
  Filled 2021-01-02: qty 100

## 2021-01-02 MED ORDER — CHLORHEXIDINE GLUCONATE 0.12 % MT SOLN
15.0000 mL | Freq: Once | OROMUCOSAL | Status: AC
  Administered 2021-01-02: 15 mL via OROMUCOSAL

## 2021-01-02 MED ORDER — PROPOFOL 10 MG/ML IV BOLUS
INTRAVENOUS | Status: AC
  Filled 2021-01-02: qty 20

## 2021-01-02 MED ORDER — FENTANYL CITRATE (PF) 100 MCG/2ML IJ SOLN
INTRAMUSCULAR | Status: DC | PRN
  Administered 2021-01-02 (×2): 50 ug via INTRAVENOUS

## 2021-01-02 MED ORDER — BUPIVACAINE-EPINEPHRINE (PF) 0.25% -1:200000 IJ SOLN
INTRAMUSCULAR | Status: DC | PRN
  Administered 2021-01-02: 20 mL via PERINEURAL

## 2021-01-02 MED ORDER — ENSURE PRE-SURGERY PO LIQD: 296.0000 mL | Freq: Once | ORAL | Status: DC

## 2021-01-02 MED ORDER — ONDANSETRON HCL 4 MG/2ML IJ SOLN
INTRAMUSCULAR | Status: DC | PRN
  Administered 2021-01-02: 4 mg via INTRAVENOUS

## 2021-01-02 MED ORDER — PROPOFOL 10 MG/ML IV BOLUS
INTRAVENOUS | Status: DC | PRN
  Administered 2021-01-02: 150 mg via INTRAVENOUS

## 2021-01-02 MED ORDER — DEXAMETHASONE SODIUM PHOSPHATE 10 MG/ML IJ SOLN
INTRAMUSCULAR | Status: AC
  Filled 2021-01-02: qty 1

## 2021-01-02 SURGICAL SUPPLY — 59 items
APL PRP STRL LF DISP 70% ISPRP (MISCELLANEOUS) ×1
BLADE SURG SZ11 CARB STEEL (BLADE) ×2 IMPLANT
CANNULA REDUC XI 12-8 STAPL (CANNULA)
CANNULA REDUCER 12-8 DVNC XI (CANNULA) IMPLANT
CHLORAPREP W/TINT 26 (MISCELLANEOUS) ×2 IMPLANT
CLSR STERI-STRIP ANTIMIC 1/2X4 (GAUZE/BANDAGES/DRESSINGS) ×2 IMPLANT
COVER SURGICAL LIGHT HANDLE (MISCELLANEOUS) ×2 IMPLANT
COVER TIP SHEARS 8 DVNC (MISCELLANEOUS) IMPLANT
COVER TIP SHEARS 8MM DA VINCI (MISCELLANEOUS) ×2
COVER WAND RF STERILE (DRAPES) IMPLANT
DECANTER SPIKE VIAL GLASS SM (MISCELLANEOUS) ×2 IMPLANT
DRAPE ARM DVNC X/XI (DISPOSABLE) ×4 IMPLANT
DRAPE COLUMN DVNC XI (DISPOSABLE) ×1 IMPLANT
DRAPE DA VINCI XI ARM (DISPOSABLE) ×6
DRAPE DA VINCI XI COLUMN (DISPOSABLE) ×2
DRSG TEGADERM 2-3/8X2-3/4 SM (GAUZE/BANDAGES/DRESSINGS) ×4 IMPLANT
ELECT REM PT RETURN 15FT ADLT (MISCELLANEOUS) ×2 IMPLANT
GAUZE SPONGE 2X2 8PLY STRL LF (GAUZE/BANDAGES/DRESSINGS) ×1 IMPLANT
GLOVE INDICATOR 8.0 STRL GRN (GLOVE) ×4 IMPLANT
GLOVE SURG ENC MOIS LTX SZ7.5 (GLOVE) ×4 IMPLANT
GOWN STRL REUS W/TWL XL LVL3 (GOWN DISPOSABLE) ×4 IMPLANT
GRASPER SUT TROCAR 14GX15 (MISCELLANEOUS) IMPLANT
KIT BASIN OR (CUSTOM PROCEDURE TRAY) ×2 IMPLANT
KIT TURNOVER KIT A (KITS) ×2 IMPLANT
MESH 3DMAX 4X6 RT LRG (Mesh General) ×1 IMPLANT
NDL INSUFFLATION 14GA 120MM (NEEDLE) IMPLANT
NEEDLE HYPO 22GX1.5 SAFETY (NEEDLE) ×2 IMPLANT
NEEDLE INSUFFLATION 14GA 120MM (NEEDLE) IMPLANT
PACK CARDIOVASCULAR III (CUSTOM PROCEDURE TRAY) ×2 IMPLANT
PAD POSITIONING PINK XL (MISCELLANEOUS) ×2 IMPLANT
SCISSORS LAP 5X35 DISP (ENDOMECHANICALS) IMPLANT
SEAL CANN UNIV 5-8 DVNC XI (MISCELLANEOUS) ×3 IMPLANT
SEAL XI 5MM-8MM UNIVERSAL (MISCELLANEOUS) ×6
SET IRRIG TUBING LAPAROSCOPIC (IRRIGATION / IRRIGATOR) IMPLANT
SOL ANTI FOG 6CC (MISCELLANEOUS) ×1 IMPLANT
SOLUTION ANTI FOG 6CC (MISCELLANEOUS) ×1
SOLUTION ELECTROLUBE (MISCELLANEOUS) ×1 IMPLANT
SPONGE GAUZE 2X2 STER 10/PKG (GAUZE/BANDAGES/DRESSINGS) ×1
SPONGE LAP 18X18 RF (DISPOSABLE) ×2 IMPLANT
STAPLER CANNULA SEAL DVNC XI (STAPLE) IMPLANT
STAPLER CANNULA SEAL XI (STAPLE)
STRIP CLOSURE SKIN 1/2X4 (GAUZE/BANDAGES/DRESSINGS) ×1 IMPLANT
SUT MNCRL AB 4-0 PS2 18 (SUTURE) ×3 IMPLANT
SUT VIC AB 0 UR5 27 (SUTURE) ×1 IMPLANT
SUT VIC AB 2-0 SH 27 (SUTURE) ×2
SUT VIC AB 2-0 SH 27X BRD (SUTURE) IMPLANT
SUT VIC AB 3-0 SH 27 (SUTURE) ×4
SUT VIC AB 3-0 SH 27XBRD (SUTURE) ×2 IMPLANT
SUT VICRYL 0 UR6 27IN ABS (SUTURE) ×2 IMPLANT
SUT VLOC 180 2-0 6IN GS21 (SUTURE) IMPLANT
SUT VLOC 180 2-0 9IN GS21 (SUTURE) IMPLANT
SUT VLOC 180 3-0 9IN GS21 (SUTURE) ×3 IMPLANT
SYR 10ML ECCENTRIC (SYRINGE) IMPLANT
SYR 20ML LL LF (SYRINGE) ×2 IMPLANT
TOWEL OR 17X26 10 PK STRL BLUE (TOWEL DISPOSABLE) ×2 IMPLANT
TOWEL OR NON WOVEN STRL DISP B (DISPOSABLE) ×2 IMPLANT
TROCAR BLADELESS OPT 5 100 (ENDOMECHANICALS) IMPLANT
TROCAR XCEL BLUNT TIP 100MML (ENDOMECHANICALS) ×2 IMPLANT
TUBING INSUFFLATION 10FT LAP (TUBING) ×2 IMPLANT

## 2021-01-02 NOTE — Anesthesia Preprocedure Evaluation (Addendum)
Anesthesia Evaluation  Patient identified by MRN, date of birth, ID band Patient awake    Reviewed: Allergy & Precautions, NPO status , Patient's Chart, lab work & pertinent test results  Airway Mallampati: II  TM Distance: >3 FB Neck ROM: Full    Dental no notable dental hx. (+) Teeth Intact, Dental Advisory Given   Pulmonary neg pulmonary ROS,    Pulmonary exam normal breath sounds clear to auscultation       Cardiovascular negative cardio ROS Normal cardiovascular exam Rhythm:Regular Rate:Normal     Neuro/Psych negative neurological ROS  negative psych ROS   GI/Hepatic negative GI ROS, Neg liver ROS,   Endo/Other  negative endocrine ROS  Renal/GU negative Renal ROS  negative genitourinary   Musculoskeletal  (+) Arthritis , Osteoarthritis,    Abdominal   Peds  Hematology negative hematology ROS (+) hct 40.1, plt 214   Anesthesia Other Findings Right inguinal hernia   Reproductive/Obstetrics negative OB ROS                            Anesthesia Physical Anesthesia Plan  ASA: I  Anesthesia Plan: General   Post-op Pain Management:    Induction: Intravenous  PONV Risk Score and Plan: 3 and Ondansetron, Dexamethasone, Midazolam and Treatment may vary due to age or medical condition  Airway Management Planned: Oral ETT  Additional Equipment: None  Intra-op Plan:   Post-operative Plan: Extubation in OR  Informed Consent: I have reviewed the patients History and Physical, chart, labs and discussed the procedure including the risks, benefits and alternatives for the proposed anesthesia with the patient or authorized representative who has indicated his/her understanding and acceptance.     Dental advisory given  Plan Discussed with: CRNA  Anesthesia Plan Comments:        Anesthesia Quick Evaluation

## 2021-01-02 NOTE — Discharge Instructions (Signed)
Douglas Shah, P.A. LAPAROSCOPIC SURGERY: POST OP INSTRUCTIONS Always review your discharge instruction sheet given to you by the facility where your surgery was performed. IF YOU HAVE DISABILITY OR FAMILY LEAVE FORMS, YOU MUST BRING THEM TO THE OFFICE FOR PROCESSING.   DO NOT GIVE THEM TO YOUR DOCTOR.  PAIN CONTROL  1. First take acetaminophen (Tylenol) AND/or ibuprofen (Advil) to control your pain after surgery.  Follow directions on package.  Taking acetaminophen (Tylenol) and/or ibuprofen (Advil) regularly after surgery will help to control your pain and lower the amount of prescription pain medication you may need.  You should not take more than 3,000 mg (3 grams) of acetaminophen (Tylenol) in 24 hours.  You should not take ibuprofen (Advil), aleve, motrin, naprosyn or other NSAIDS if you have a history of stomach ulcers or chronic kidney disease.  2. A prescription for pain medication may be given to you upon discharge.  Take your pain medication as prescribed, if you still have uncontrolled pain after taking acetaminophen (Tylenol) or ibuprofen (Advil). 3. Use ice packs to help control pain. 4. If you need a refill on your pain medication, please contact your pharmacy.  They will contact our office to request authorization. Prescriptions will not be filled after 5pm or on week-ends.  HOME MEDICATIONS 5. Take your usually prescribed medications unless otherwise directed.  DIET 6. You should follow a light diet the first few days after arrival home.  Be sure to include lots of fluids daily. Avoid fatty, fried foods.   CONSTIPATION 7. It is common to experience some constipation after surgery and if you are taking pain medication.  Increasing fluid intake and taking a stool softener (such as Colace) will usually help or prevent this problem from occurring.  A mild laxative (Milk of Magnesia or Miralax) should be taken according to package instructions if there are no bowel  movements after 48 hours.  WOUND/INCISION CARE 8. Most patients will experience some swelling and bruising in the area of the incisions.  Ice packs will help.  Swelling and bruising can take several days to resolve.  9. Unless discharge instructions indicate otherwise, follow guidelines below  a. STERI-STRIPS - you may remove your outer bandages 48 hours after surgery, and you may shower at that time.  You have steri-strips (small skin tapes) in place directly over the incision.  These strips should be left on the skin for 7-10 days.   b. DERMABOND/SKIN GLUE - you may shower in 24 hours.  The glue will flake off over the next 2-3 weeks. 10. Any sutures or staples will be removed at the office during your follow-up visit.  ACTIVITIES 11. You may resume regular (light) daily activities beginning the next day--such as daily self-care, walking, climbing stairs--gradually increasing activities as tolerated.  You may have sexual intercourse when it is comfortable.  Refrain from any heavy lifting or straining until approved by your doctor. a. You may drive when you are no longer taking prescription pain medication, you can comfortably wear a seatbelt, and you can safely maneuver your car and apply brakes.  FOLLOW-UP 12. You should see your doctor in the office for a follow-up appointment approximately 2-3 weeks after your surgery.  You should have been given your post-op/follow-up appointment when your surgery was scheduled.  If you did not receive a post-op/follow-up appointment, make sure that you call for this appointment within a day or two after you arrive home to insure a convenient appointment time.  OTHER  INSTRUCTIONS 13. Do not lift, push, pull anything greater than 10 lbs for 1 month  WHEN TO CALL YOUR DOCTOR: 1. Fever over 101.0 2. Inability to urinate 3. Continued bleeding from incision. 4. Increased pain, redness, or drainage from the incision. 5. Increasing abdominal pain  The clinic  staff is available to answer your questions during regular business hours.  Please don't hesitate to call and ask to speak to one of the nurses for clinical concerns.  If you have a medical emergency, go to the nearest emergency room or call 911.  A surgeon from [facility] is always on call at the hospital. [address], [place], [place], [place]  [address] ? [address], [place], [place]   [address] [phone] ? [phone] ? FAX [phone] Web site: [email or web address]  ........Marland Kitchen   Managing Your Pain After Surgery Without Opioids    Thank you for participating in our program to help patients manage their pain after surgery without opioids. This is part of our effort to provide you with the best care possible, without exposing you or your family to the risk that opioids pose.  What pain can I expect after surgery? You can expect to have some pain after surgery. This is normal. The pain is typically worse the day after surgery, and quickly begins to get better. Many studies have found that many patients are able to manage their pain after surgery with Over-the-Counter (OTC) medications such as Tylenol and Motrin. If you have a condition that does not allow you to take Tylenol or Motrin, notify your surgical team.  How will I manage my pain? The best strategy for controlling your pain after surgery is around the clock pain control with Tylenol (acetaminophen) and Motrin (ibuprofen or Advil). Alternating these medications with each other allows you to maximize your pain control. In addition to Tylenol and Motrin, you can use heating pads or ice packs on your incisions to help reduce your pain.  How will I alternate your regular strength over-the-counter pain medication? You will take a dose of pain medication every three hours. ; Start by taking 650 mg of Tylenol (2 pills of 325 mg) ; 3 hours later take 600 mg of Motrin (3 pills of 200 mg) ; 3 hours  after taking the Motrin take 650 mg of Tylenol ; 3 hours after that take 600 mg of Motrin.   - 1 -  See example - if your first dose of Tylenol is at 12:00 PM   12:00 PM Tylenol 650 mg (2 pills of 325 mg)  3:00 PM Motrin 600 mg (3 pills of 200 mg)  6:00 PM Tylenol 650 mg (2 pills of 325 mg)  9:00 PM Motrin 600 mg (3 pills of 200 mg)  Continue alternating every 3 hours   We recommend that you follow this schedule around-the-clock for at least 3 days after surgery, or until you feel that it is no longer needed. Use the table on the last page of this handout to keep track of the medications you are taking. Important: Do not take more than 3000mg  of Tylenol or 1800mg  of Motrin in a 24-hour period. Do not take ibuprofen/Motrin if you have a history of bleeding stomach ulcers, severe kidney disease, &/or actively taking a blood thinner  What if I still have pain? If you have pain that is not controlled with the over-the-counter pain medications (Tylenol and Motrin or Advil) you might have what we call "breakthrough" pain.  You will receive a prescription for a small amount of an opioid pain medication such as Oxycodone, Tramadol, or Tylenol with Codeine. Use these opioid pills in the first 24 hours after surgery if you have breakthrough pain. Do not take more than 1 pill every 4-6 hours.  If you still have uncontrolled pain after using all opioid pills, don't hesitate to call our staff using the number provided. We will help make sure you are managing your pain in the best way possible, and if necessary, we can provide a prescription for additional pain medication.   Day 1    Time  Name of Medication Number of pills taken  Amount of Acetaminophen  Pain Level   Comments  AM PM       AM PM       AM PM       AM PM       AM PM       AM PM       AM PM       AM PM       Total Daily amount of Acetaminophen Do not take more than  3,000 mg per day      Day 2    Time  Name of  Medication Number of pills taken  Amount of Acetaminophen  Pain Level   Comments  AM PM       AM PM       AM PM       AM PM       AM PM       AM PM       AM PM       AM PM       Total Daily amount of Acetaminophen Do not take more than  3,000 mg per day      Day 3    Time  Name of Medication Number of pills taken  Amount of Acetaminophen  Pain Level   Comments  AM PM       AM PM       AM PM       AM PM          AM PM       AM PM       AM PM       AM PM       Total Daily amount of Acetaminophen Do not take more than  3,000 mg per day      Day 4    Time  Name of Medication Number of pills taken  Amount of Acetaminophen  Pain Level   Comments  AM PM       AM PM       AM PM       AM PM       AM PM       AM PM       AM PM       AM PM       Total Daily amount of Acetaminophen Do not take more than  3,000 mg per day      Day 5    Time  Name of Medication Number of pills taken  Amount of Acetaminophen  Pain Level   Comments  AM PM       AM PM       AM PM       AM PM       AM PM  AM PM       AM PM       AM PM       Total Daily amount of Acetaminophen Do not take more than  3,000 mg per day       Day 6    Time  Name of Medication Number of pills taken  Amount of Acetaminophen  Pain Level  Comments  AM PM       AM PM       AM PM       AM PM       AM PM       AM PM       AM PM       AM PM       Total Daily amount of Acetaminophen Do not take more than  3,000 mg per day      Day 7    Time  Name of Medication Number of pills taken  Amount of Acetaminophen  Pain Level   Comments  AM PM       AM PM       AM PM       AM PM       AM PM       AM PM       AM PM       AM PM       Total Daily amount of Acetaminophen Do not take more than  3,000 mg per day        For additional information about how and where to safely dispose of unused opioid medications - [employer].com.br  Disclaimer: This  document contains information and/or instructional materials adapted from [place] for the typical patient with your condition. It does not replace medical advice from your health care provider because your experience may differ from that of the typical patient. Talk to your health care provider if you have any questions about this document, your condition or your treatment plan. Adapted from [place]

## 2021-01-02 NOTE — Brief Op Note (Signed)
01/02/2021  4:16 PM  PATIENT:  Douglas Shah  60 y.o. male  PRE-OPERATIVE DIAGNOSIS:  RIGHT INGUINAL HERNIA  POST-OPERATIVE DIAGNOSIS:  RIGHT INGUINAL HERNIA  PROCEDURE:  Procedure(s) with comments: XI ROBOTIC ASSISTED INGUINAL HERNIA REPAIR WITH MESH (Right) -   SURGEON:  Surgeon(s) and Role:    Greer Pickerel, MD - Primary  PHYSICIAN ASSISTANT:   ASSISTANTS: none   ANESTHESIA:   general  EBL:  25 mL   BLOOD ADMINISTERED:none  DRAINS: none   LOCAL MEDICATIONS USED:  MARCAINE     SPECIMEN:  No Specimen  DISPOSITION OF SPECIMEN:  N/A  COUNTS:  YES   TOURNIQUET:  * No tourniquets in log *  DICTATION: .Dragon Dictation  PLAN OF CARE: Discharge to home after PACU  PATIENT DISPOSITION:  PACU - hemodynamically stable.   Delay start of Pharmacological VTE agent (>24hrs) due to surgical blood loss or risk of bleeding: not applicable

## 2021-01-02 NOTE — Transfer of Care (Signed)
Immediate Anesthesia Transfer of Care Note  Patient: Douglas Shah  Procedure(s) Performed: XI ROBOTIC ASSISTED INGUINAL HERNIA REPAIR WITH MESH (Right )  Patient Location: PACU  Anesthesia Type:General  Level of Consciousness: awake, alert  and oriented  Airway & Oxygen Therapy: Patient Spontanous Breathing and Patient connected to face mask  Post-op Assessment: Report given to RN and Post -op Vital signs reviewed and stable  Post vital signs: Reviewed and stable  Last Vitals:  Vitals Value Taken Time  BP 146/89 01/02/21 1546  Temp    Pulse 93 01/02/21 1550  Resp 11 01/02/21 1550  SpO2 98 % 01/02/21 1550  Vitals shown include unvalidated device data.  Last Pain:  Vitals:   01/02/21 1143  TempSrc:   PainSc: 0-No pain         Complications: No complications documented.

## 2021-01-02 NOTE — Op Note (Signed)
PREOPERATIVE DIAGNOSIS: right inguinal hernia.    POSTOPERATIVE DIAGNOSIS: right direct inguinal hernia   PROCEDURE: Robotic/XI repair of right direct inguinal hernia with  mesh (rTAPP).    SURGEON: [name]. Redmond Pulling, MD    ASSISTANT SURGEON: None.    ANESTHESIA: General plus local consisting of 0.25% Marcaine with epi.    ESTIMATED BLOOD LOSS: Minimal.    FINDINGS: The patient had a right direct hernia.  It was repaired using Bard 3dmax large right mesh. No contralateral hernia   SPECIMEN: none   INDICATIONS FOR PROCEDURE: 60 yo presented for repair of a symptomatic inguinal hernia. The risks and benefits including but not limited to bleeding, infection, chronic inguinal pain, nerve entrapment, hernia recurrence, mesh complications, hematoma formation, urinary retention, injury to the testicles or the ovaries, numbness in the groin, blood clots, injury to the surrounding structures, and anesthesia risk was discussed with the patient.   DESCRIPTION OF PROCEDURE: After obtaining informed consent & marking the operative side with confirmation by the patient, the patient was then taken back to the operating room, placed  supine on the operating room table. General endotracheal anesthesia was  established. The patient had emptied their bladder prior to going back to  the operating room. Sequential compression devices were placed. The  abdomen and groin were prepped and draped in the usual standard surgical  fashion with ChloraPrep. The patient received oral Tylenol/gabapentin preoperatively as well as IV  antibiotic prior to the incision. A surgical time-out was performed.  Local was infiltrated at the base of the umbilicus.    Next, a 1-cm vertical supraumbilical incision was made with a #11 blade. The fascia  was grasped and lifted anteriorly. Next, the fascia was incised, and  the abdominal cavity was entered. Pursestring suture was placed around  the fascial edges using a 0 Vicryl. A  12-mm Hasson trocar was placed.  Pneumoperitoneum was smoothly established up to a patient pressure of 15  mmHg. Robotic Laparoscope was advanced.  There was no evidence of injury to surrounding structures.  Patient had a right direct inguinal hernia.  Unfortunately, the patient's bladder was still moderately full. So the circulating nurse was able to place a foley under the sterile drapes without contaminating the operative field. The bladder was nicely decompressed at this point.  laparoscopic tap block was performed.      Two additional 24mm robotic trochars were placed one on patient's left and one on patient's right in the midclavicular line about 2 cm above the supraumbilical trocar.  A large piece of right Bard 3D max mesh were placed through the [facility] trocar into the abdominal cavity.  I went ahead and placed two 3-0 Vicryl suture on SH into the abdomen off to the side.  I then placed one 3-0 absorbable V-Loc suture through the [facility] trocar into the abdomen off to the side.  I then placed a 8 mm trocar through the [facility] trocar.   We then deployed the robot for pelvic surgery from patient's right.  The robot was docked.  Robotic laparoscope was placed through the supraumbilical trocar and the anatomy was targeted.  The other arms were then connected to the trochars.  A pair of MCS scissors was placed through the right trocar and a fenestrated bipolar through the left trocar all under direct visualization.  I then scrubbed out and went to the robotic console.    I then made incision along the peritoneum on the right, starting 2 inches above the anterior superior  iliac spine and caring it medial toward the median umbilical ligament in a lazy S configuration using MCS scissors with electrocautery. The peritoneal flap was then gently dissected downward from the anterior abdominal wall taking care not to  injure the inferior epigastric vessels. The pubic bone & copper's ligament were identified. The  direct sac was stripped. Dissection continued for about 1.5cm inferior to the coppers. No femoral or obturator hernia identified. The testicular vessels were identified.  I mobilized the peritoneum off of the testicular vessels. The testicular vessels had been identified and preserved. The vas deferens was identified and preserved, and the pertioneal flap was stripped from those to  surrounding structures.    I then went about creating a large pocket by lifting the peritoneum of the pelvic floor. I took great care not to injure the iliac vessels.      I then obtained the previously placed piece of Bard large 3D right max mesh for the right groin and placed it into the inguinal area.   half of it covered medial  to the inferior epigastric vessels and half of it lateral to the  inferior epigastric vessels and it extended below coppers ligament. The defect was well  covered with the mesh.  I then secured the mesh to Cooper's ligament with an interrupted 3-0 Vicryl suture.  I placed an additional suture superior medially along the edge of the mesh medial to the inferior gastric vessel.  I placed a 3 Vicryl suture laterally along the superior lateral edge of the mesh lateral to the inferior epigastric vessels.  I then closed the peritoneal flap with a running 3-0 Vicryl V-Loc starting laterally going medially.  I did inadvertently cut the 3-0 vloc with the suture driver as I was nearing the midline. So the scrub tech placed another 3-0 vloc suture in the abdomen and I started that one medially and ran it laterally.  The mesh was well covered.  There is no defect in the peritoneal flap.  The mesh was flat.  It had not curled up.    The surgical robot was undocked and moved away from the OR table.  I scrubbed back in.   we then placed a laparoscopic needle driver and all of the suture needles and suture pieces that had been placed in the abdomen at the begin the case were removed      I removed the Hasson  trocar and tied down the previously placed pursestring suture. I did place an additional interrupted 0 vicryl suture at the umbilical closure.  The closure was viewed laparoscopically. There was no evidence of fascial defect. There was no air leak at the umbilicus. There was no  evidence of injury to surrounding structures. Pneumoperitoneum was released, and the remaining trocars were removed. All skin incisions  were closed with a 4-0 Monocryl in a subcuticular fashion followed by application of Steri-Strips and sterile bandages. All needle, instrument, and sponge counts  were correct x2.   There are no immediate complications. The patient tolerated the procedure well. The patient was extubated and taken to the  recovery room in stable condition.  [name]. Redmond Pulling, MD, FACS General, Bariatric, & Minimally Invasive Surgery [facility], [place]

## 2021-01-02 NOTE — Anesthesia Procedure Notes (Signed)
Procedure Name: Intubation Date/Time: 01/02/2021 10:32 AM Performed by: [place] Maurice Ramseur C, CRNA Pre-anesthesia Checklist: Patient identified, Emergency Drugs available, Suction available and Patient being monitored Patient Re-evaluated:Patient Re-evaluated prior to induction Oxygen Delivery Method: Circle system utilized Preoxygenation: Pre-oxygenation with 100% oxygen Induction Type: IV induction Ventilation: Mask ventilation without difficulty Laryngoscope Size: Mac and 4 Grade View: Grade I Tube type: Oral Tube size: 7.5 mm Number of attempts: 1 Airway Equipment and Method: Oral airway Placement Confirmation: ETT inserted through vocal cords under direct vision,  positive ETCO2 and breath sounds checked- equal and bilateral Secured at: 21 cm Tube secured with: Tape Dental Injury: Teeth and Oropharynx as per pre-operative assessment

## 2021-01-02 NOTE — Anesthesia Postprocedure Evaluation (Signed)
Anesthesia Post Note  Patient: Douglas Shah  Procedure(s) Performed: XI ROBOTIC ASSISTED INGUINAL HERNIA REPAIR WITH MESH (Right )     Patient location during evaluation: PACU Anesthesia Type: General Level of consciousness: awake and alert, oriented and patient cooperative Pain management: pain level controlled Vital Signs Assessment: post-procedure vital signs reviewed and stable Respiratory status: spontaneous breathing, nonlabored ventilation and respiratory function stable Cardiovascular status: blood pressure returned to baseline and stable Postop Assessment: no apparent nausea or vomiting Anesthetic complications: no   No complications documented.  Last Vitals:  Vitals:   01/02/21 1630 01/02/21 1645  BP: (!) 131/91 132/90  Pulse: 73 72  Resp: 13 16  Temp:  36.5 C  SpO2: 98% 98%    Last Pain:  Vitals:   01/02/21 1645  TempSrc:   PainSc: 0-No pain                 Pervis Hocking

## 2021-01-02 NOTE — Interval H&P Note (Signed)
History and Physical Interval Note:  01/02/2021 1:08 PM  Douglas Shah  has presented today for surgery, with the diagnosis of RIGHT INGUINAL HERNIA.  The various methods of treatment have been discussed with the patient and family. After consideration of risks, benefits and other options for treatment, the patient has consented to  Procedure(s) with comments: XI [place] (Right) - 90 MINUTES IN ROOM 2 as a surgical intervention.  The patient's history has been reviewed, patient examined, no change in status, stable for surgery.  I have reviewed the patient's chart and labs.  Questions were answered to the patient's satisfaction.     [name]. Redmond Pulling, MD, FACS General, Bariatric, & Minimally Invasive Surgery [facility] [place]  Greer Pickerel

## 2021-01-03 ENCOUNTER — Encounter (HOSPITAL_COMMUNITY): Payer: Self-pay | Admitting: General Surgery

## 2021-02-06 ENCOUNTER — Other Ambulatory Visit (HOSPITAL_COMMUNITY): Payer: 59

## 2021-02-09 SURGERY — Surgical Case
Anesthesia: *Unknown

## 2021-05-04 ENCOUNTER — Encounter: Payer: Self-pay | Admitting: Family Medicine

## 2021-05-04 ENCOUNTER — Ambulatory Visit: Payer: BC Managed Care – PPO | Admitting: Family Medicine

## 2021-05-04 ENCOUNTER — Other Ambulatory Visit: Payer: Self-pay

## 2021-05-04 VITALS — BP 137/81 | HR 79 | Resp 17

## 2021-05-04 DIAGNOSIS — R221 Localized swelling, mass and lump, neck: Secondary | ICD-10-CM

## 2021-05-04 NOTE — Progress Notes (Signed)
Acute Office Visit  Subjective:    Patient ID: Douglas Shah, male    DOB: 01-27-1961, 60 y.o.   MRN: [id number]  Chief Complaint  Patient presents with   Mass    Left neck     HPI Patient is in today for lump on neck.   About an hour ago, patient's wife pointed out a large lump on his left neck. States he had not noticed it earlier in the day. Denies any symptoms: no pain, difficulty swallowing or breathing. No recent fatigue, temperature intolerance, changes to hair, skin or nails, weight changes. States "I'm feeling great overall and workout at least four to five times a week."  Normal lab work in March 2022. No recent immunizations or illness.     Past Medical History:  Diagnosis Date   Arthritis    Chronic elbow pain left   Diverticulitis 04/28/2019   Confirm CT scan ED June 2020   Sessile colonic polyp 07/20/2019    Past Surgical History:  Procedure Laterality Date   EXERCISE TREADMILL STRESS TEST  04-19-2008   LOW RISK/ NO ISCHEMIA   HIP ARTHROSCOPY W/ LABRAL DEBRIDEMENT  03-09-2011   RIGHT HIP   REPAIR EXTENSOR TENDON  08/15/2012   Procedure: REPAIR EXTENSOR TENDON;  Surgeon: Magnus Sinning, MD;  Location: [facility];  Service: Orthopedics;  Laterality: Left;  WITH PARTIAL LATERAL EPICONDYLECTOMY OF THE LEFT ELBOW    RIGHT SHOULDER SURGERY  2000   XI ROBOTIC ASSISTED INGUINAL HERNIA REPAIR WITH MESH Right 01/02/2021   Procedure: XI ROBOTIC ASSISTED INGUINAL HERNIA REPAIR WITH MESH;  Surgeon: Greer Pickerel, MD;  Location: [facility];  Service: General;  Laterality: Right;  61 MINUTES IN ROOM 2    Family History  Problem Relation Age of Onset   COPD Mother    Heart attack Father    Colon cancer Neg Hx     Social History   Socioeconomic History   Marital status: Married    Spouse name: Not on file   Number of children: Not on file   Years of education: Not on file   Highest education level: Not on file  Occupational History   Not on file   Tobacco Use   Smoking status: Never   Smokeless tobacco: Current    Types: Snuff  Substance and Sexual Activity   Alcohol use: Yes    Alcohol/week: 3.0 standard drinks    Types: 3 Cans of beer per week    Comment: socially   Drug use: No   Sexual activity: Not on file  Other Topics Concern   Not on file  Social History Narrative   Not on file   Social Determinants of Health   Financial Resource Strain: Not on file  Food Insecurity: Not on file  Transportation Needs: Not on file  Physical Activity: Not on file  Stress: Not on file  Social Connections: Not on file  Intimate Partner Violence: Not on file    Outpatient Medications Prior to Visit  Medication Sig Dispense Refill   oxyCODONE (OXY IR/ROXICODONE) 5 MG immediate release tablet Take 1 tablet (5 mg total) by mouth every 6 (six) hours as needed for severe pain. 12 tablet 0   sildenafil (VIAGRA) 100 MG tablet Take 0.5-1 tablets (50-100 mg total) by mouth daily as needed for erectile dysfunction. 10 tablet 11   tamsulosin (FLOMAX) 0.4 MG CAPS capsule Take 1 capsule (0.4 mg total) by mouth daily after supper. 90 capsule 1  No facility-administered medications prior to visit.    No Known Allergies  Review of Systems All review of systems negative except what is listed in the HPI     Objective:    Physical Exam Vitals reviewed.  Constitutional:      Appearance: Normal appearance.  HENT:     Head: Normocephalic and atraumatic.  Neck:     Comments: Approx 2 cm nodule to left of midline, rather superficial, mobile, not significantly tender, no erythema or surrounding edema Musculoskeletal:     Cervical back: Normal range of motion and neck supple.  Lymphadenopathy:     Cervical: No cervical adenopathy.  Skin:    General: Skin is warm and dry.     Findings: No erythema or rash.  Neurological:     General: No focal deficit present.     Mental Status: He is alert and oriented to person, place, and time. Mental  status is at baseline.  Psychiatric:        Mood and Affect: Mood normal.        Behavior: Behavior normal.        Thought Content: Thought content normal.        Judgment: Judgment normal.        BP 137/81   Pulse 79   Resp 17   SpO2 99%  Wt Readings from Last 3 Encounters:  01/02/21 161 lb 4 oz (73.1 kg)  11/21/20 166 lb (75.3 kg)  10/03/20 167 lb 14.4 oz (76.2 kg)    Health Maintenance Due  Topic Date Due   Pneumococcal Vaccine 10-92 Years old (1 - PCV) Never done   Zoster Vaccines- Shingrix (1 of 2) Never done    There are no preventive care reminders to display for this patient.   No results found for: TSH Lab Results  Component Value Date   WBC 5.0 12/29/2020   HGB 13.6 12/29/2020   HCT 40.1 12/29/2020   MCV 91.3 12/29/2020   PLT 214 12/29/2020   Lab Results  Component Value Date   NA 132 (L) 12/29/2020   K 4.9 12/29/2020   CO2 24 12/29/2020   GLUCOSE 102 (H) 12/29/2020   BUN 11 12/29/2020   CREATININE 0.89 12/29/2020   BILITOT 0.7 10/03/2020   ALKPHOS 67 11/11/2015   AST 18 10/03/2020   ALT 23 10/03/2020   PROT 6.4 10/03/2020   ALBUMIN 4.7 11/11/2015   CALCIUM 9.3 12/29/2020   ANIONGAP 8 12/29/2020   Lab Results  Component Value Date   CHOL 204 (H) 08/27/2019   Lab Results  Component Value Date   HDL 62 08/27/2019   Lab Results  Component Value Date   LDLCALC 125 (H) 08/27/2019   Lab Results  Component Value Date   TRIG 80 08/27/2019   Lab Results  Component Value Date   CHOLHDL 3.3 08/27/2019   No results found for: HGBA1C     Assessment & Plan:   1. Neck nodule On exam, nodule has consistency of an enlarged lymph node, but concerned given location and would rather not go with a watch and wait approach despite no other symptoms.  Feels more superficial than thyroid tissue, but will go ahead order [place] head/neck for further evaluation. Discussed with Dr. Zigmund Daniel. Further workup with possible labs pending [place] results.  Patient  aware of signs/symptoms requiring further/urgent evaluation.  - US Soft Tissue Head/Neck (NON-THYROID); Future  Follow-up pending test results.   Purcell Nails Olevia Bowens, DNP, FNP-C

## 2021-05-04 NOTE — Patient Instructions (Signed)
Go downstairs to radiology department to schedule ultrasound.

## 2021-05-05 ENCOUNTER — Ambulatory Visit (HOSPITAL_BASED_OUTPATIENT_CLINIC_OR_DEPARTMENT_OTHER)
Admission: RE | Admit: 2021-05-05 | Discharge: 2021-05-05 | Disposition: A | Discharge status: Home / Self Care | Payer: BC Managed Care – PPO | Source: Ambulatory Visit | Attending: Family Medicine | Admitting: Family Medicine

## 2021-05-05 DIAGNOSIS — R590 Localized enlarged lymph nodes: Secondary | ICD-10-CM | POA: Diagnosis not present

## 2021-05-05 DIAGNOSIS — R221 Localized swelling, mass and lump, neck: Secondary | ICD-10-CM | POA: Diagnosis not present

## 2021-05-08 NOTE — Addendum Note (Signed)
Addended by: Caleen Jobs B on: 05/08/2021 08:18 AM   Modules accepted: Orders

## 2021-05-08 NOTE — Progress Notes (Signed)
MyChart message sent: The ultrasound report indicates that the nodule has the appearance of an enlarged lymph node, but recommended a CT scan for further evaluation of the area. I am going to place these orders and you should get a call about scheduling. If you don't hear anything by the end of this week then please let us know. If you begin developing any other symptoms please let us know.

## 2021-05-09 ENCOUNTER — Other Ambulatory Visit: Payer: Self-pay

## 2021-05-09 ENCOUNTER — Ambulatory Visit (INDEPENDENT_AMBULATORY_CARE_PROVIDER_SITE_OTHER): Payer: BC Managed Care – PPO

## 2021-05-09 DIAGNOSIS — R221 Localized swelling, mass and lump, neck: Secondary | ICD-10-CM | POA: Diagnosis not present

## 2021-05-09 DIAGNOSIS — E041 Nontoxic single thyroid nodule: Secondary | ICD-10-CM | POA: Diagnosis not present

## 2021-05-09 DIAGNOSIS — R590 Localized enlarged lymph nodes: Secondary | ICD-10-CM | POA: Diagnosis not present

## 2021-05-09 MED ORDER — IOHEXOL 300 MG/ML  SOLN
100.0000 mL | Freq: Once | INTRAMUSCULAR | Status: AC | PRN
  Administered 2021-05-09: 75 mL via INTRAVENOUS

## 2021-05-11 NOTE — Addendum Note (Signed)
Addended by: Caleen Jobs B on: 05/11/2021 02:08 PM   Modules accepted: Orders

## 2021-05-11 NOTE — Progress Notes (Signed)
The CT of your neck continues to suggest that this is an enlarged lymph node. Thankfully, there are no other enlarged lymph nodes in the neck. The next step of evaluation would be a biopsy. I'm going to refer you to [facility] for them to evaluate and biopsy if needed. Please let us know if you have any questions.

## 2021-10-25 ENCOUNTER — Other Ambulatory Visit: Payer: Self-pay

## 2021-10-25 ENCOUNTER — Encounter: Payer: Self-pay | Admitting: Medical-Surgical

## 2021-10-25 ENCOUNTER — Ambulatory Visit: Payer: BC Managed Care – PPO | Admitting: Medical-Surgical

## 2021-10-25 VITALS — BP 136/82 | HR 75 | Temp 98.6°F | Ht 68.0 in | Wt 164.0 lb

## 2021-10-25 DIAGNOSIS — R197 Diarrhea, unspecified: Secondary | ICD-10-CM | POA: Diagnosis not present

## 2021-10-25 DIAGNOSIS — R509 Fever, unspecified: Secondary | ICD-10-CM | POA: Diagnosis not present

## 2021-10-25 DIAGNOSIS — R109 Unspecified abdominal pain: Secondary | ICD-10-CM | POA: Diagnosis not present

## 2021-10-25 DIAGNOSIS — R10A0 Flank pain, unspecified side: Secondary | ICD-10-CM

## 2021-10-25 LAB — POCT URINALYSIS DIP (CLINITEK)
Bilirubin, UA: NEGATIVE
Blood, UA: NEGATIVE
Glucose, UA: NEGATIVE mg/dL
Ketones, POC UA: NEGATIVE mg/dL
Leukocytes, UA: NEGATIVE
Nitrite, UA: NEGATIVE
POC PROTEIN,UA: NEGATIVE
Spec Grav, UA: 1.015 (ref 1.010–1.025)
Urobilinogen, UA: 0.2 E.U./dL
pH, UA: 6 (ref 5.0–8.0)

## 2021-10-25 LAB — POCT INFLUENZA A/B
Influenza A, POC: NEGATIVE
Influenza B, POC: NEGATIVE

## 2021-10-25 NOTE — Progress Notes (Signed)
°  HPI with pertinent ROS:   CC: Fever, flank pain  HPI: Pleasant 60 year old male presenting today for evaluation of max 100.5 that started last night.  He has had left flank pain for the last 2 weeks intermittently that is described as a dull ache.  Also noted diarrhea last night and this morning.  Has had some fatigue over the last couple of days but last night, the fatigue worsened until he was unable to complete his regular workout on his spin bike.  When he was forced to abort his workout, he went upstairs to check his temperature and discovered the fever.  He has a history of having left flank pain with kidney infections and was worried that he may have another one starting.  He has had no other symptoms including chills, chest pain, shortness of breath, coughing, sinus congestion, ear pressure/pain, rhinorrhea, nausea, or vomiting.  Has also had no urinary burning, frequency, urgency, or hematuria.  Has not been around anyone that is known to be sick but did have his children over for Christmas.    I reviewed the past medical history, family history, social history, surgical history, and allergies today and no changes were needed.  Please see the problem list section below in epic for further details.   Physical exam:   General: Well Developed, well nourished, and in no acute distress.  Neuro: Alert and oriented x3.  HEENT: Normocephalic, atraumatic, pupils equal round reactive to light, neck supple, no masses, no lymphadenopathy, thyroid nonpalpable.  Skin: Warm and dry. Cardiac: Regular rate and rhythm, no murmurs rubs or gallops, no lower extremity edema.  Respiratory: Clear to auscultation bilaterally. Not using accessory muscles, speaking in full sentences. Abdomen: Soft, nontender, nondistended. Bowel sounds + x 4 quadrants. No HSM appreciated.  No CVA tenderness   Impression and Recommendations:    1. Flank pain 2. Fever, unspecified fever cause 3. Diarrhea, unspecified  type Unclear etiology.  POCT urinalysis negative without hematuria so kidney stone unlikely.  No indication of infection.  POCT flu negative.  Checking CBC with differential and CMP.  Advised patient to continue monitoring for further signs and symptoms that may develop.  Okay to use Tylenol and/or ibuprofen for pain/fever.  Drink plenty of fluids to stay well-hydrated and rest as needed. - POCT URINALYSIS DIP (CLINITEK) - POCT Influenza A/B - CBC with Differential - COMPLETE METABOLIC PANEL WITH GFR  Return if symptoms worsen or fail to improve. ___________________________________________ Clearnce Sorrel, DNP, APRN, FNP-BC [facility]

## 2021-10-26 DIAGNOSIS — R109 Unspecified abdominal pain: Secondary | ICD-10-CM | POA: Diagnosis not present

## 2021-10-26 DIAGNOSIS — R509 Fever, unspecified: Secondary | ICD-10-CM | POA: Diagnosis not present

## 2021-10-26 DIAGNOSIS — R197 Diarrhea, unspecified: Secondary | ICD-10-CM | POA: Diagnosis not present

## 2021-10-26 LAB — COMPLETE METABOLIC PANEL WITH GFR
AG Ratio: 2.6 (calc) — ABNORMAL HIGH (ref 1.0–2.5)
ALT: 30 U/L (ref 9–46)
AST: 21 U/L (ref 10–35)
Albumin: 4.7 g/dL (ref 3.6–5.1)
Alkaline phosphatase (APISO): 64 U/L (ref 35–144)
BUN: 11 mg/dL (ref 7–25)
CO2: 28 mmol/L (ref 20–32)
Calcium: 9.2 mg/dL (ref 8.6–10.3)
Chloride: 98 mmol/L (ref 98–110)
Creat: 1.11 mg/dL (ref 0.70–1.35)
Globulin: 1.8 g/dL (calc) — ABNORMAL LOW (ref 1.9–3.7)
Glucose, Bld: 86 mg/dL (ref 65–99)
Potassium: 4.5 mmol/L (ref 3.5–5.3)
Sodium: 133 mmol/L — ABNORMAL LOW (ref 135–146)
Total Bilirubin: 0.6 mg/dL (ref 0.2–1.2)
Total Protein: 6.5 g/dL (ref 6.1–8.1)
eGFR: 76 mL/min/{1.73_m2} (ref >=60)

## 2021-10-26 LAB — CBC WITH DIFFERENTIAL/PLATELET
Absolute Monocytes: 666 cells/uL (ref 200–950)
Basophils Absolute: 19 cells/uL (ref 0–200)
Basophils Relative: 0.6 %
Eosinophils Absolute: 118 cells/uL (ref 15–500)
Eosinophils Relative: 3.7 %
HCT: 43.6 % (ref 38.5–50.0)
Hemoglobin: 14.7 g/dL (ref 13.2–17.1)
Lymphs Abs: 765 cells/uL — ABNORMAL LOW (ref 850–3900)
MCH: 30.8 pg (ref 27.0–33.0)
MCHC: 33.7 g/dL (ref 32.0–36.0)
MCV: 91.4 fL (ref 80.0–100.0)
MPV: 11.5 fL (ref 7.5–12.5)
Monocytes Relative: 20.8 %
Neutro Abs: 1632 cells/uL (ref 1500–7800)
Neutrophils Relative %: 51 %
Platelets: 188 10*3/uL (ref 140–400)
RBC: 4.77 10*6/uL (ref 4.20–5.80)
RDW: 11.8 % (ref 11.0–15.0)
Total Lymphocyte: 23.9 %
WBC: 3.2 10*3/uL — ABNORMAL LOW (ref 3.8–10.8)

## 2021-10-27 ENCOUNTER — Encounter: Payer: Self-pay | Admitting: Medical-Surgical

## 2022-01-18 ENCOUNTER — Other Ambulatory Visit: Payer: Self-pay | Admitting: Family Medicine

## 2022-04-13 DIAGNOSIS — S61231A Puncture wound without foreign body of left index finger without damage to nail, initial encounter: Secondary | ICD-10-CM | POA: Diagnosis not present

## 2022-06-08 DIAGNOSIS — K5792 Diverticulitis of intestine, part unspecified, without perforation or abscess without bleeding: Secondary | ICD-10-CM | POA: Diagnosis not present

## 2022-07-02 DIAGNOSIS — M79642 Pain in left hand: Secondary | ICD-10-CM | POA: Diagnosis not present

## 2022-07-02 DIAGNOSIS — X153XXA Contact with hot saucepan or skillet, initial encounter: Secondary | ICD-10-CM | POA: Diagnosis not present

## 2022-07-02 DIAGNOSIS — T23002A Burn of unspecified degree of left hand, unspecified site, initial encounter: Secondary | ICD-10-CM | POA: Diagnosis not present

## 2022-07-02 DIAGNOSIS — Z880 Allergy status to penicillin: Secondary | ICD-10-CM | POA: Diagnosis not present

## 2022-07-02 DIAGNOSIS — Z883 Allergy status to other anti-infective agents status: Secondary | ICD-10-CM | POA: Diagnosis not present

## 2022-07-02 DIAGNOSIS — T23202A Burn of second degree of left hand, unspecified site, initial encounter: Secondary | ICD-10-CM | POA: Diagnosis not present

## 2022-07-02 DIAGNOSIS — Z882 Allergy status to sulfonamides status: Secondary | ICD-10-CM | POA: Diagnosis not present

## 2022-07-11 ENCOUNTER — Encounter: Payer: Self-pay | Admitting: Gastroenterology

## 2022-07-23 ENCOUNTER — Ambulatory Visit (INDEPENDENT_AMBULATORY_CARE_PROVIDER_SITE_OTHER): Payer: BC Managed Care – PPO

## 2022-07-23 ENCOUNTER — Encounter: Payer: Self-pay | Admitting: Family Medicine

## 2022-07-23 ENCOUNTER — Ambulatory Visit: Payer: BC Managed Care – PPO | Admitting: Family Medicine

## 2022-07-23 VITALS — BP 117/71 | HR 66 | Ht 68.0 in | Wt 160.0 lb

## 2022-07-23 DIAGNOSIS — M79674 Pain in right toe(s): Secondary | ICD-10-CM

## 2022-07-23 DIAGNOSIS — M25474 Effusion, right foot: Secondary | ICD-10-CM | POA: Diagnosis not present

## 2022-07-23 DIAGNOSIS — E782 Mixed hyperlipidemia: Secondary | ICD-10-CM | POA: Diagnosis not present

## 2022-07-23 DIAGNOSIS — R351 Nocturia: Secondary | ICD-10-CM | POA: Diagnosis not present

## 2022-07-23 DIAGNOSIS — N401 Enlarged prostate with lower urinary tract symptoms: Secondary | ICD-10-CM | POA: Diagnosis not present

## 2022-07-23 DIAGNOSIS — M7989 Other specified soft tissue disorders: Secondary | ICD-10-CM | POA: Diagnosis not present

## 2022-07-23 MED ORDER — MELOXICAM 15 MG PO TABS: 15.0000 mg | ORAL_TABLET | Freq: Every day | ORAL | 0 refills | Status: AC

## 2022-07-23 NOTE — Assessment & Plan Note (Signed)
OA vs gouty arthritis.  Xrays of foot ordered.  Checking uric acid.  Add meloxicam daily as needed.  May benefit from injection of MTP joint and can schedule with Dr. Dianah Field if he desires this.

## 2022-07-23 NOTE — Assessment & Plan Note (Signed)
Update PSA 

## 2022-07-23 NOTE — Patient Instructions (Signed)
Try meloxicam for joint pain.  You may also apply voltaren gel (over the counter) to the toe.  Use as directed on packaging.  We'll be in touch with labs and xray results.

## 2022-07-23 NOTE — Assessment & Plan Note (Signed)
Update lipid panel.  

## 2022-07-23 NOTE — Progress Notes (Signed)
Douglas Shah - 61 y.o. male MRN [id number]  Date of birth: 1960-12-20  Subjective Chief Complaint  Patient presents with   Toe Pain    HPI Douglas Shah is a 61 y.o. male here today with complaint of pain in the great toe of the R foot.  He has been dealing with this off and on for the past several months.  He reports swelling of the joint.  He has not noticed any significant warmth or erythema.  He denies any significant pain in other joints.  No prior history of gout.   He would also like to go ahead and have his annual labs updated.   ROS:  A comprehensive ROS was completed and negative except as noted per HPI  Allergies  Allergen Reactions   Ciprofloxacin Hcl Other (See Comments)    "Joint and muscle" issues    Past Medical History:  Diagnosis Date   Arthritis    Chronic elbow pain left   Diverticulitis 04/28/2019   Confirm CT scan ED June 2020   Sessile colonic polyp 07/20/2019    Past Surgical History:  Procedure Laterality Date   EXERCISE TREADMILL STRESS TEST  04-19-2008   LOW RISK/ NO ISCHEMIA   HIP ARTHROSCOPY W/ LABRAL DEBRIDEMENT  03-09-2011   RIGHT HIP   REPAIR EXTENSOR TENDON  08/15/2012   Procedure: REPAIR EXTENSOR TENDON;  Surgeon: Magnus Sinning, MD;  Location: [place] [facility];  Service: Orthopedics;  Laterality: Left;  WITH PARTIAL LATERAL EPICONDYLECTOMY OF THE LEFT ELBOW    RIGHT SHOULDER SURGERY  2000   XI ROBOTIC ASSISTED INGUINAL HERNIA REPAIR WITH MESH Right 01/02/2021   Procedure: XI ROBOTIC ASSISTED INGUINAL HERNIA REPAIR WITH MESH;  Surgeon: Greer Pickerel, MD;  Location: [facility];  Service: General;  Laterality: Right;  90 MINUTES IN ROOM 2    Social History   Socioeconomic History   Marital status: Married    Spouse name: Not on file   Number of children: Not on file   Years of education: Not on file   Highest education level: Not on file  Occupational History   Not on file  Tobacco Use   Smoking status: Never   Smokeless  tobacco: Current    Types: Snuff  Substance and Sexual Activity   Alcohol use: Yes    Alcohol/week: 3.0 standard drinks of alcohol    Types: 3 Cans of beer per week    Comment: socially   Drug use: No   Sexual activity: Not on file  Other Topics Concern   Not on file  Social History Narrative   Not on file   Social Determinants of Health   Financial Resource Strain: Not on file  Food Insecurity: Not on file  Transportation Needs: Not on file  Physical Activity: Not on file  Stress: Not on file  Social Connections: Not on file    Family History  Problem Relation Age of Onset   COPD Mother    Heart attack Father    Colon cancer Neg Hx     Health Maintenance  Topic Date Due   COVID-19 Vaccine (2 - Pfizer risk series) 11/29/2022 (Originally 03/04/2020)   Zoster Vaccines- Shingrix (1 of 2) 11/29/2022 (Originally 07/23/1980)   INFLUENZA VACCINE  01/27/2023 (Originally 05/29/2022)   COLONOSCOPY (Pts 45-18yr Insurance coverage will need to be confirmed)  07/24/2023 (Originally 06/03/2022)   TETANUS/TDAP  10/18/2025   Hepatitis C Screening  Completed   HIV Screening  Completed  HPV VACCINES  Aged Out     ----------------------------------------------------------------------------------------------------------------------------------------------------------------------------------------------------------------- Physical Exam BP 117/71 (BP Location: Left Arm, Patient Position: Sitting, Cuff Size: Normal)   Pulse 66   Ht '5\' 8"'$  (1.727 m)   Wt 160 lb (72.6 kg)   SpO2 100%   BMI 24.33 kg/m   Physical Exam Constitutional:      Appearance: Normal appearance.  Eyes:     General: No scleral icterus. Musculoskeletal:     Cervical back: Neck supple.     Comments: Swelling of MTP joint of R foot.  TTP without erythema or warmth.    Neurological:     Mental Status: He is alert.  Psychiatric:        Mood and Affect: Mood normal.        Behavior: Behavior normal.      ------------------------------------------------------------------------------------------------------------------------------------------------------------------------------------------------------------------- Assessment and Plan  Swelling of first metatarsophalangeal (MTP) joint of right foot OA vs gouty arthritis.  Xrays of foot ordered.  Checking uric acid.  Add meloxicam daily as needed.  May benefit from injection of MTP joint and can schedule with Dr. Dianah Field if he desires this.     BPH (benign prostatic hyperplasia) Update PSA  HLD (hyperlipidemia) Update lipid panel.    Meds ordered this encounter  Medications   meloxicam (MOBIC) 15 MG tablet    Sig: Take 1 tablet (15 mg total) by mouth daily.    Dispense:  30 tablet    Refill:  0    No follow-ups on file.    This visit occurred during the SARS-CoV-2 public health emergency.  Safety protocols were in place, including screening questions prior to the visit, additional usage of staff PPE, and extensive cleaning of exam room while observing appropriate contact time as indicated for disinfecting solutions.

## 2022-07-24 ENCOUNTER — Encounter: Payer: Self-pay | Admitting: Gastroenterology

## 2022-07-24 LAB — LIPID PANEL W/REFLEX DIRECT LDL
Cholesterol: 190 mg/dL (ref <200)
HDL: 73 mg/dL (ref >=40)
LDL Cholesterol (Calc): 102 mg/dL (calc) — ABNORMAL HIGH
Non-HDL Cholesterol (Calc): 117 mg/dL (calc) (ref <130)
Total CHOL/HDL Ratio: 2.6 (calc) (ref <5.0)
Triglycerides: 67 mg/dL (ref <150)

## 2022-07-24 LAB — COMPLETE METABOLIC PANEL WITH GFR
AG Ratio: 2.5 (calc) (ref 1.0–2.5)
ALT: 25 U/L (ref 9–46)
AST: 24 U/L (ref 10–35)
Albumin: 4.8 g/dL (ref 3.6–5.1)
Alkaline phosphatase (APISO): 70 U/L (ref 35–144)
BUN: 10 mg/dL (ref 7–25)
CO2: 29 mmol/L (ref 20–32)
Calcium: 9.2 mg/dL (ref 8.6–10.3)
Chloride: 98 mmol/L (ref 98–110)
Creat: 0.98 mg/dL (ref 0.70–1.35)
Globulin: 1.9 g/dL (calc) (ref 1.9–3.7)
Glucose, Bld: 92 mg/dL (ref 65–99)
Potassium: 4.6 mmol/L (ref 3.5–5.3)
Sodium: 133 mmol/L — ABNORMAL LOW (ref 135–146)
Total Bilirubin: 0.7 mg/dL (ref 0.2–1.2)
Total Protein: 6.7 g/dL (ref 6.1–8.1)
eGFR: 88 mL/min/{1.73_m2} (ref >=60)

## 2022-07-24 LAB — CBC WITH DIFFERENTIAL/PLATELET
Absolute Monocytes: 521 cells/uL (ref 200–950)
Basophils Absolute: 29 cells/uL (ref 0–200)
Basophils Relative: 0.7 %
Eosinophils Absolute: 90 cells/uL (ref 15–500)
Eosinophils Relative: 2.2 %
HCT: 42 % (ref 38.5–50.0)
Hemoglobin: 13.9 g/dL (ref 13.2–17.1)
Lymphs Abs: 894 cells/uL (ref 850–3900)
MCH: 31 pg (ref 27.0–33.0)
MCHC: 33.1 g/dL (ref 32.0–36.0)
MCV: 93.8 fL (ref 80.0–100.0)
MPV: 11 fL (ref 7.5–12.5)
Monocytes Relative: 12.7 %
Neutro Abs: 2567 cells/uL (ref 1500–7800)
Neutrophils Relative %: 62.6 %
Platelets: 182 10*3/uL (ref 140–400)
RBC: 4.48 10*6/uL (ref 4.20–5.80)
RDW: 11.8 % (ref 11.0–15.0)
Total Lymphocyte: 21.8 %
WBC: 4.1 10*3/uL (ref 3.8–10.8)

## 2022-07-24 LAB — URIC ACID: Uric Acid, Serum: 4.3 mg/dL (ref 4.0–8.0)

## 2022-07-24 LAB — PSA: PSA: 0.59 ng/mL (ref <=4.00)

## 2022-08-06 ENCOUNTER — Encounter: Payer: Self-pay | Admitting: Emergency Medicine

## 2022-08-06 ENCOUNTER — Ambulatory Visit
Admission: EM | Admit: 2022-08-06 | Discharge: 2022-08-06 | Disposition: A | Discharge status: Home / Self Care | Payer: BC Managed Care – PPO

## 2022-08-06 DIAGNOSIS — S61011A Laceration without foreign body of right thumb without damage to nail, initial encounter: Secondary | ICD-10-CM | POA: Diagnosis not present

## 2022-08-06 NOTE — ED Provider Notes (Signed)
[facility]    CSN: [id number] Arrival date & time: 08/06/22  6720      History   Chief Complaint Chief Complaint  Patient presents with   Wound Infection    HPI Douglas Shah is a 61 y.o. male.   Pleasant 61 year old male presents today for evaluation of his right thumb.  States he was standing on a ladder yesterday morning trying to trim a tree in his yard.  The ladder started to wobble, and he fell off, landing on the deck.  His thumb hit the deck in such a way that it caused a laceration to the base of his thumb nail.  This happened around is concerned because every time however it is motionless.  Patient does take fish oil and meloxicam, but otherwise is not on any blood thinners.  Denies any sensation loss, reports full range of motion of his thumb.  States he cleaned the area extensively with soap and water yesterday, last tetanus vaccination is only 61 years old.  No additional concerns today.     Past Medical History:  Diagnosis Date   Arthritis    Chronic elbow pain left   Diverticulitis 04/28/2019   Confirm CT scan ED June 2020   Sessile colonic polyp 07/20/2019    Patient Active Problem List   Diagnosis Date Noted   Swelling of first metatarsophalangeal (MTP) joint of right foot 07/23/2022   Sessile colonic polyp 07/20/2019   Diverticulitis 04/28/2019   BPH (benign prostatic hyperplasia) 07/16/2018   HLD (hyperlipidemia) 07/16/2018   ED (erectile dysfunction) 12/09/2017   AK (actinic keratosis) 07/15/2017   Snuff user 07/15/2017    Past Surgical History:  Procedure Laterality Date   EXERCISE TREADMILL STRESS TEST  04-19-2008   LOW RISK/ NO ISCHEMIA   HIP ARTHROSCOPY W/ LABRAL DEBRIDEMENT  03-09-2011   RIGHT HIP   REPAIR EXTENSOR TENDON  08/15/2012   Procedure: REPAIR EXTENSOR TENDON;  Surgeon: Magnus Sinning, MD;  Location: [facility];  Service: Orthopedics;  Laterality: Left;  WITH PARTIAL LATERAL EPICONDYLECTOMY OF THE  LEFT ELBOW    RIGHT SHOULDER SURGERY  2000   XI ROBOTIC ASSISTED INGUINAL HERNIA REPAIR WITH MESH Right 01/02/2021   Procedure: XI ROBOTIC ASSISTED INGUINAL HERNIA REPAIR WITH MESH;  Surgeon: Greer Pickerel, MD;  Location[facility];  Service: General;  Laterality: Right;  90 MINUTES IN ROOM 2       Home Medications    Prior to Admission medications   Medication Sig Start Date End Date Taking? Authorizing Provider  meloxicam (MOBIC) 15 MG tablet Take 1 tablet (15 mg total) by mouth daily. 07/23/22  Yes Luetta Nutting, DO  sildenafil (VIAGRA) 100 MG tablet Take 1/2-1 tablet (50-100 mg total) by mouth daily as needed for erectile dysfunction. 01/18/22  Yes Luetta Nutting, DO    Family History Family History  Problem Relation Age of Onset   COPD Mother    Heart attack Father    Colon cancer Neg Hx     Social History Social History   Tobacco Use   Smoking status: Never   Smokeless tobacco: Current    Types: Snuff  Substance Use Topics   Alcohol use: Yes    Alcohol/week: 3.0 standard drinks of alcohol    Types: 3 Cans of beer per week    Comment: socially   Drug use: No     Allergies   Ciprofloxacin hcl   Review of Systems Review of Systems As per HPI  Physical Exam Triage Vital Signs ED Triage Vitals  Enc Vitals Group     BP 08/06/22 0830 128/81     Pulse Rate 08/06/22 0830 68     Resp 08/06/22 0830 18     Temp 08/06/22 0830 99 F (37.2 C)     Temp Source 08/06/22 0830 Oral     SpO2 08/06/22 0830 98 %     Weight 08/06/22 0831 160 lb (72.6 kg)     Height 08/06/22 0831 '5\' 8"'$  (1.727 m)     Head Circumference --      Peak Flow --      Pain Score 08/06/22 0830 7     Pain Loc --      Pain Edu? --      Excl. in [place]? --    No data found.  Updated Vital Signs BP 128/81 (BP Location: Left Arm)   Pulse 68   Temp 99 F (37.2 C) (Oral)   Resp 18   Ht '5\' 8"'$  (1.727 m)   Wt 160 lb (72.6 kg)   SpO2 98%   BMI 24.33 kg/m   Visual Acuity Right Eye Distance:   Left  Eye Distance:   Bilateral Distance:    Right Eye Near:   Left Eye Near:    Bilateral Near:     Physical Exam Vitals and nursing note reviewed. Exam conducted with a chaperone present.  Constitutional:      General: He is not in acute distress.    Appearance: Normal appearance. He is normal weight. He is not ill-appearing or toxic-appearing.  HENT:     Head: Normocephalic and atraumatic.     Mouth/Throat:     Mouth: Mucous membranes are moist.  Eyes:     General: No scleral icterus.       Right eye: No discharge.        Left eye: No discharge.     Extraocular Movements: Extraocular movements intact.     Pupils: Pupils are equal, round, and reactive to light.  Cardiovascular:     Rate and Rhythm: Normal rate.  Pulmonary:     Effort: Pulmonary effort is normal. No respiratory distress.  Musculoskeletal:        General: Signs of injury (small healing laceration to distal thumb, just superior to nailbed. Bleed controlled, appears clean. Neurovascularly intact. No injury to nailbed itself. No FB) present. No swelling or tenderness. Normal range of motion.  Skin:    General: Skin is warm and dry.     Capillary Refill: Capillary refill takes less than 2 seconds.     Coloration: Skin is not jaundiced.     Findings: No bruising, erythema or rash.  Neurological:     General: No focal deficit present.     Mental Status: He is alert and oriented to person, place, and time.     Sensory: No sensory deficit.     Motor: No weakness.  Psychiatric:        Mood and Affect: Mood normal.        Behavior: Behavior normal.      UC Treatments / Results  Labs (all labs ordered are listed, but only abnormal results are displayed) Labs Reviewed - No data to display  EKG   Radiology No results found.  Procedures Procedures (including critical care time)  Medications Ordered in UC Medications - No data to display  Initial Impression / Assessment and Plan / UC Course  I have reviewed  the triage  vital signs and the nursing notes.  Pertinent labs & imaging results that were available during my care of the patient were reviewed by me and considered in my medical decision making (see chart for details).     Laceration of R thumb -injury occurred greater than 24 hours ago.  No indication for closure at this time.  Patient wear a thumb splint to prevent excessive on the last noted the wound, recommended patient leave open at nighttime only cover during the day if out and about. Splint placed on finger in office, abx ointment also applied. Monitor for any changes. Tdap UTD.    Final Clinical Impressions(s) / UC Diagnoses   Final diagnoses:  Laceration of right thumb without foreign body without damage to nail, initial encounter     Discharge Instructions      Your laceration cannot be sewed up due to timeframe from injury We will let this heal by secondary intention. Please wear the splint for a minimum of 5 days to prevent excessive tension on the cut. Please let it UN-bandaged for 8-10 hours daily to allow it to dry. Wet wounds do not heal. You may apply topical bacitracin. Please read the attached handout.   ED Prescriptions   None    PDMP not reviewed this encounter.   Chaney Malling, [place] 08/06/22 [phone]

## 2022-08-06 NOTE — ED Triage Notes (Signed)
Patient states that he fell off of his ladder yesterday injuring his right thumb.  Patient concerned about the nail on the thumb.  Patient is having pain, able to move the finger.  Area was cleaned yesterday.  Last Tdap x 3 years ago.

## 2022-08-06 NOTE — Discharge Instructions (Signed)
Your laceration cannot be sewed up due to timeframe from injury We will let this heal by secondary intention. Please wear the splint for a minimum of 5 days to prevent excessive tension on the cut. Please let it UN-bandaged for 8-10 hours daily to allow it to dry. Wet wounds do not heal. You may apply topical bacitracin. Please read the attached handout.

## 2022-08-07 ENCOUNTER — Telehealth: Payer: Self-pay

## 2022-08-07 NOTE — Telephone Encounter (Signed)
TC to f/u after yesterday's visit to [facility]. No answer; left VM to call [phone] for problems or questions. 

## 2022-08-20 ENCOUNTER — Ambulatory Visit (AMBULATORY_SURGERY_CENTER): Payer: Self-pay

## 2022-08-20 VITALS — Ht 68.0 in | Wt 165.0 lb

## 2022-08-20 DIAGNOSIS — Z86010 Personal history of colonic polyps: Secondary | ICD-10-CM

## 2022-08-20 MED ORDER — NA SULFATE-K SULFATE-MG SULF 17.5-3.13-1.6 GM/177ML PO SOLN: 1.0000 | Freq: Once | ORAL | 0 refills | Status: AC

## 2022-08-20 NOTE — Progress Notes (Signed)
No egg or soy allergy known to patient  No issues known to pt with past sedation with any surgeries or procedures Patient denies ever being told they had issues or difficulty with intubation  No FH of Malignant Hyperthermia Pt is not on diet pills Pt is not on home 02  Pt is not on blood thinners  Pt denies issues with constipation  No A fib or A flutter Have any cardiac testing pending--NO Pt instructed to use Singlecare.com or GoodRx for a price reduction on prep   Insurance verified during [place] appt=[employer]  Patient's chart reviewed by Osvaldo Angst CNRA prior to previsit and patient appropriate for the [facility].  Previsit completed and red dot placed by patient's name on their procedure day (on provider's schedule).

## 2022-08-23 ENCOUNTER — Ambulatory Visit (INDEPENDENT_AMBULATORY_CARE_PROVIDER_SITE_OTHER): Payer: BC Managed Care – PPO

## 2022-08-23 ENCOUNTER — Ambulatory Visit
Admission: EM | Admit: 2022-08-23 | Discharge: 2022-08-23 | Disposition: A | Discharge status: Home / Self Care | Payer: BC Managed Care – PPO | Attending: Family Medicine | Admitting: Family Medicine

## 2022-08-23 DIAGNOSIS — M79644 Pain in right finger(s): Secondary | ICD-10-CM | POA: Diagnosis not present

## 2022-08-23 DIAGNOSIS — W11XXXA Fall on and from ladder, initial encounter: Secondary | ICD-10-CM

## 2022-08-23 DIAGNOSIS — S62524A Nondisplaced fracture of distal phalanx of right thumb, initial encounter for closed fracture: Secondary | ICD-10-CM | POA: Diagnosis not present

## 2022-08-23 DIAGNOSIS — S62521B Displaced fracture of distal phalanx of right thumb, initial encounter for open fracture: Secondary | ICD-10-CM

## 2022-08-23 DIAGNOSIS — M7989 Other specified soft tissue disorders: Secondary | ICD-10-CM | POA: Diagnosis not present

## 2022-08-23 DIAGNOSIS — M19041 Primary osteoarthritis, right hand: Secondary | ICD-10-CM | POA: Diagnosis not present

## 2022-08-23 NOTE — ED Triage Notes (Signed)
Pt presents with concern for rt thumb infection. Pt endorses pain, redness, and swelling.

## 2022-08-23 NOTE — Discharge Instructions (Addendum)
Advised patient of right thumb x-ray results with hard copy provided to patient.  Advised may use OTC Ibuprofen 600 to 800 mg daily, as needed for right thumb pain. Encouraged patient increase daily water intake to 64 ounces per day while taking this medication.  Advised patient to wear right thumb splint 24/7 until being evaluated by [facility] orthopedic provider.  Advised patient to contact [facility] orthopedic provider immediately following this office visit for further evaluation of right thumb fracture.

## 2022-08-23 NOTE — ED Provider Notes (Signed)
[facility]    CSN: [id number] Arrival date & time: 08/23/22  0956      History   Chief Complaint Chief Complaint  Patient presents with   thumb infection    HPI Douglas Shah is a 61 y.o. male.   HPI Pleasant 61 year old male presents with concern of right thumb infection.  Patient reports injuring her right thumb when falling from a ladder over 2 weeks ago.  Reports injuring tip of thumb and still has soft tissue swelling and pain with mild redness noted PMH significant for BPH, HLD, and history of diverticulitis.  Past Medical History:  Diagnosis Date   Arthritis    right foot   Chronic elbow pain left   Diverticulitis 04/28/2019   Confirm CT scan ED June 2020   Seasonal allergies    Sessile colonic polyp 07/20/2019    Patient Active Problem List   Diagnosis Date Noted   Swelling of first metatarsophalangeal (MTP) joint of right foot 07/23/2022   Sessile colonic polyp 07/20/2019   Diverticulitis 04/28/2019   BPH (benign prostatic hyperplasia) 07/16/2018   HLD (hyperlipidemia) 07/16/2018   ED (erectile dysfunction) 12/09/2017   AK (actinic keratosis) 07/15/2017   Snuff user 07/15/2017    Past Surgical History:  Procedure Laterality Date   COLONOSCOPY  2020   MS-MAC-suprep (good)-tics/int hems/SSP x 1/TA x 2-3 yr recall   EXERCISE TREADMILL STRESS TEST  04/19/2008   LOW RISK/ NO ISCHEMIA   HIP ARTHROSCOPY W/ LABRAL DEBRIDEMENT  03/09/2011   RIGHT HIP   POLYPECTOMY  2020   TA x 2, SSP x 1   REPAIR EXTENSOR TENDON  08/15/2012   Procedure: REPAIR EXTENSOR TENDON;  Surgeon: Magnus Sinning, MD;  Location: [facility];  Service: Orthopedics;  Laterality: Left;  WITH PARTIAL LATERAL EPICONDYLECTOMY OF THE LEFT ELBOW    REPAIR EXTENSOR TENDON Right    RIGHT SHOULDER SURGERY  10/29/1998   XI ROBOTIC ASSISTED INGUINAL HERNIA REPAIR WITH MESH Right 01/02/2021   Procedure: XI ROBOTIC ASSISTED INGUINAL HERNIA REPAIR WITH MESH;  Surgeon:  Greer Pickerel, MD;  Location: [facility];  Service: General;  Laterality: Right;  90 MINUTES IN ROOM 2       Home Medications    Prior to Admission medications   Medication Sig Start Date End Date Taking? Authorizing Provider  Cholecalciferol (VITAMIN D3 PO) Take 1,000 Int'l Units/day by mouth daily at 6 (six) AM.    [provider]  fluticasone (FLONASE) 50 MCG/ACT nasal spray Place 2 sprays into both nostrils daily.    [provider]  meloxicam (MOBIC) 15 MG tablet Take 1 tablet (15 mg total) by mouth daily. Patient taking differently: Take 15 mg by mouth daily as needed for pain. 07/23/22   Luetta Nutting, DO  Omega-3 Fatty Acids (FISH OIL PO) Take 2,150 mg by mouth daily at 6 (six) AM.    [provider]  pseudoephedrine (SUDAFED) 120 MG 12 hr tablet Take 120 mg by mouth daily as needed for congestion.    [provider]  sildenafil (VIAGRA) 100 MG tablet Take 1/2-1 tablet (50-100 mg total) by mouth daily as needed for erectile dysfunction. 01/18/22   Luetta Nutting, DO  vitamin E 180 MG (400 UNITS) capsule Take 400 Units by mouth daily.    [provider]    Family History Family History  Problem Relation Age of Onset   COPD Mother    Heart attack Father    Colon cancer Neg  Hx    Colon polyps Neg Hx    Esophageal cancer Neg Hx    Rectal cancer Neg Hx    Stomach cancer Neg Hx     Social History Social History   Tobacco Use   Smoking status: Never   Smokeless tobacco: Current    Types: Snuff  Vaping Use   Vaping Use: Never used  Substance Use Topics   Alcohol use: Not Currently    Alcohol/week: 0.0 - 3.0 standard drinks of alcohol   Drug use: No     Allergies   Ciprofloxacin hcl   Review of Systems Review of Systems  Skin:  Positive for rash.     Physical Exam Triage Vital Signs ED Triage Vitals  Enc Vitals Group     BP 08/23/22 1004 (!) 149/91     Pulse Rate 08/23/22 1004 78     Resp 08/23/22 1004 14     Temp  08/23/22 1004 99 F (37.2 C)     Temp Source 08/23/22 1004 Oral     SpO2 08/23/22 1004 98 %     Weight --      Height --      Head Circumference --      Peak Flow --      Pain Score 08/23/22 1003 8     Pain Loc --      Pain Edu? --      Excl. in [place]? --    No data found.  Updated Vital Signs BP (!) 149/91 (BP Location: Right Arm)   Pulse 78   Temp 99 F (37.2 C) (Oral)   Resp 14   SpO2 98%      Physical Exam Vitals and nursing note reviewed.  Constitutional:      General: He is not in acute distress.    Appearance: Normal appearance. He is normal weight. He is not ill-appearing.  HENT:     Head: Normocephalic and atraumatic.     Mouth/Throat:     Mouth: Mucous membranes are moist.     Pharynx: Oropharynx is clear.  Eyes:     Extraocular Movements: Extraocular movements intact.     Conjunctiva/sclera: Conjunctivae normal.     Pupils: Pupils are equal, round, and reactive to light.  Cardiovascular:     Rate and Rhythm: Normal rate and regular rhythm.     Pulses: Normal pulses.     Heart sounds: Normal heart sounds. No murmur heard. Pulmonary:     Effort: Pulmonary effort is normal.     Breath sounds: Normal breath sounds. No wheezing or rhonchi.  Musculoskeletal:        General: Normal range of motion.     Cervical back: Normal range of motion and neck supple.  Skin:    General: Skin is warm and dry.     Comments: Right thumb (dorsum): Mildly erythematous cuticle , TTP over distal phalanx, with mild soft tissue swelling noted-please see image inserted below  Neurological:     General: No focal deficit present.     Mental Status: He is alert and oriented to person, place, and time. Mental status is at baseline.       UC Treatments / Results  Labs (all labs ordered are listed, but only abnormal results are displayed) Labs Reviewed - No data to display  EKG   Radiology DG Finger Thumb Right  Result Date: 08/23/2022 CLINICAL DATA:  [place] off a ladder 3  weeks ago, injury, pain, redness, swelling, question  infection EXAM: RIGHT THUMB 2+V COMPARISON:  None Available. FINDINGS: Soft tissue swelling RIGHT thumb. Osseous mineralization normal. Joint spaces preserved. Transverse nondisplaced fracture at base of distal phalanx. No additional fracture, dislocation, or bone destruction. Mild spur formation noted at IP joint thumb. IMPRESSION: Transverse nondisplaced fracture, base of distal phalanx RIGHT thumb. Degenerative changes IP joint RIGHT thumb. Electronically Signed   By: Lavonia Dana M.D.   On: 08/23/2022 11:46    Procedures Procedures (including critical care time)  Medications Ordered in UC Medications - No data to display  Initial Impression / Assessment and Plan / UC Course  I have reviewed the triage vital signs and the nursing notes.  Pertinent labs & imaging results that were available during my care of the patient were reviewed by me and considered in my medical decision making (see chart for details).     MDM: 1. Advised patient of right thumb x-ray results with hard copy provided to patient.  Advised may use OTC Ibuprofen 600 to 800 mg daily, as needed for right thumb pain.  Encouraged patient increase daily water intake to 64 ounces per day while taking this medication.  Advised patient to wear right thumb splint 24/7 until being evaluated by [facility] orthopedic provider.  Advised patient to contact [facility] orthopedic provider immediately following this office visit for further evaluation of right thumb fracture.  Patient discharged home, hemodynamically stable. Final Clinical Impressions(s) / UC Diagnoses   Final diagnoses:  Pain of right thumb  Displaced fracture of distal phalanx of right thumb, initial encounter for open fracture     Discharge Instructions      Advised patient of right thumb x-ray results with hard copy provided to patient.  Advised may use OTC Ibuprofen 600 to 800 mg daily, as needed for right thumb  pain. Encouraged patient increase daily water intake to 64 ounces per day while taking this medication.  Advised patient to wear right thumb splint 24/7 until being evaluated by [facility] orthopedic provider.  Advised patient to contact [facility] orthopedic provider immediately following this office visit for further evaluation of right thumb fracture.     ED Prescriptions   None    I have reviewed the PDMP during this encounter.   Eliezer Lofts, [place] 08/23/22 1210

## 2022-08-24 ENCOUNTER — Ambulatory Visit: Payer: BC Managed Care – PPO | Admitting: Family Medicine

## 2022-08-24 ENCOUNTER — Encounter: Payer: Self-pay | Admitting: Gastroenterology

## 2022-08-24 VITALS — BP 140/84 | Ht 68.0 in | Wt 160.0 lb

## 2022-08-24 DIAGNOSIS — S62524A Nondisplaced fracture of distal phalanx of right thumb, initial encounter for closed fracture: Secondary | ICD-10-CM | POA: Diagnosis not present

## 2022-08-24 NOTE — Progress Notes (Unsigned)
SM[facility]: Attending Note: I have examined the patient, reviewed the chart, discussed the assessment and plan with the Sports Medicine Fellow. I agree with assessment and treatment plan as detailed in the Fellow's note.  

## 2022-08-24 NOTE — Progress Notes (Unsigned)
   Established Patient Office Visit  Subjective   Patient ID: Douglas Shah, male    DOB: 1960-11-26  Age: 61 y.o. MRN: [id number]  Follow-up from urgent care right thumb fracture.  Douglas Shah today for follow-up of right thumb fracture from urgent care.  He reports about 3 weeks ago he fell off a ladder onto the steps at his house.  He sustained a laceration to the distal portion of his thumb.  He saw urgent care who cleaned the abrasion and sent him with a bandage.  He continued to have pain in his thumb especially with activities.  As his pain persisted he again presented to the urgent care yesterday where x-rays were performed.  X-rays revealed nondisplaced transverse fracture of the distal phalanx of the right thumb.  He was placed on metal plate with Coban to provide immobilization and told to follow-up.  He reports his thumb feels much better and immobilized.  He denies any fevers, chills or drainage from the area.  The laceration has healed at this time.  Last tetanus shot was in 2016.  Of note he is right-handed.   ROS as listed above in HPI    Objective:     BP (!) 140/84   Ht '5\' 8"'$  (1.727 m)   Wt 160 lb (72.6 kg)   BMI 24.33 kg/m   Physical Exam Vitals reviewed.  Constitutional:      General: He is not in acute distress.    Appearance: Normal appearance. He is not ill-appearing, toxic-appearing or diaphoretic.  Pulmonary:     Effort: Pulmonary effort is normal.  Neurological:     Mental Status: He is alert.   Right thumb: There is a small scabbed area at the nailbed.  No drainage from the area.  There is a small area of erythema and swelling around the distal thumb.  Tenderness to palpation at the DIP.  Full range of motion flexion extension and opposition.  Decreased strength with pincer grasp secondary to pain.  Radial pulse 2+ bilateral.    Assessment & Plan:   Problem List Items Addressed This Visit       Musculoskeletal and Integument   Nondisplaced  fracture of distal phalanx of right thumb, initial encounter for closed fracture - Primary    Xray transverse nondisplaced fracture, this may have an open fracture however no concern at this point for infection.  He was given universal thumb splint for immobilization.  Continue with immobilization for another 2 weeks.  Upon my evaluation of the x-rays he did not appear to be any bony callus formed.  He may discontinue the splint to type and wall sitting however he should remain in the splint while on about during the day.  Follow-up in 2 weeks, we can consider ultrasound at that time to evaluate healing.       Return in about 2 weeks (around 09/07/2022), or if symptoms worsen or fail to improve.    Elmore Guise, DO

## 2022-08-24 NOTE — Assessment & Plan Note (Signed)
Xray transverse nondisplaced fracture, this may have an open fracture however no concern at this point for infection.  He was given universal thumb splint for immobilization.  Continue with immobilization for another 2 weeks.  Upon my evaluation of the x-rays he did not appear to be any bony callus formed.  He may discontinue the splint to type and wall sitting however he should remain in the splint while on about during the day.  Follow-up in 2 weeks, we can consider ultrasound at that time to evaluate healing.

## 2022-08-25 ENCOUNTER — Encounter: Payer: Self-pay | Admitting: Family Medicine

## 2022-09-03 ENCOUNTER — Encounter: Payer: Self-pay | Admitting: Gastroenterology

## 2022-09-03 ENCOUNTER — Ambulatory Visit (AMBULATORY_SURGERY_CENTER): Payer: BC Managed Care – PPO | Admitting: Gastroenterology

## 2022-09-03 VITALS — BP 98/66 | HR 62 | Temp 98.4°F | Resp 18 | Ht 68.0 in | Wt 165.0 lb

## 2022-09-03 DIAGNOSIS — Z86010 Personal history of colonic polyps: Secondary | ICD-10-CM

## 2022-09-03 DIAGNOSIS — Z1211 Encounter for screening for malignant neoplasm of colon: Secondary | ICD-10-CM | POA: Diagnosis not present

## 2022-09-03 DIAGNOSIS — D123 Benign neoplasm of transverse colon: Secondary | ICD-10-CM

## 2022-09-03 DIAGNOSIS — Z09 Encounter for follow-up examination after completed treatment for conditions other than malignant neoplasm: Secondary | ICD-10-CM | POA: Diagnosis not present

## 2022-09-03 MED ORDER — SODIUM CHLORIDE 0.9 % IV SOLN: 500.0000 mL | Freq: Once | INTRAVENOUS | Status: DC

## 2022-09-03 NOTE — Progress Notes (Signed)
VS by DT  Pt's states no medical or surgical changes since previsit or office visit.  

## 2022-09-03 NOTE — Op Note (Signed)
[facility] Patient Name: Douglas Shah Procedure Date: 09/03/2022 7:28 AM MRN: [id number] Endoscopist: Ladene Artist , MD, [id number] Age: 61 Referring MD:  Date of Birth: August 06, 1961 Gender: Male Account #: [id number] Procedure:                Colonoscopy Indications:              Surveillance: Personal history of adenomatous and                            sessile serrated polyps on last colonoscopy 3 years                            ago Medicines:                Monitored Anesthesia Care Procedure:                Pre-Anesthesia Assessment:                           - Prior to the procedure, a History and Physical                            was performed, and patient medications and                            allergies were reviewed. The patient's tolerance of                            previous anesthesia was also reviewed. The risks                            and benefits of the procedure and the sedation                            options and risks were discussed with the patient.                            All questions were answered, and informed consent                            was obtained. Prior Anticoagulants: The patient has                            taken no anticoagulant or antiplatelet agents. ASA                            Grade Assessment: II - A patient with mild systemic                            disease. After reviewing the risks and benefits,                            the patient was deemed in satisfactory condition to  undergo the procedure.                           After obtaining informed consent, the colonoscope                            was passed under direct vision. Throughout the                            procedure, the patient's blood pressure, pulse, and                            oxygen saturations were monitored continuously. The                            PCF-HQ190L Colonoscope was introduced through the                             anus and advanced to the the cecum, identified by                            appendiceal orifice and ileocecal valve. The                            ileocecal valve, appendiceal orifice, and rectum                            were photographed. The quality of the bowel                            preparation was adequate. The colonoscopy was                            performed without difficulty. The patient tolerated                            the procedure well. Scope In: 8:04:44 AM Scope Out: 8:17:40 AM Scope Withdrawal Time: 0 hours 10 minutes 56 seconds  Total Procedure Duration: 0 hours 12 minutes 56 seconds  Findings:                 The perianal and digital rectal examinations were                            normal.                           Two sessile polyps were found in the transverse                            colon. The polyps were 8 mm in size. These polyps                            were removed with a cold snare. Resection and  retrieval were complete.                           A few small-mouthed diverticula were found in the                            left colon.                           Internal hemorrhoids were found during                            retroflexion. The hemorrhoids were small and Grade                            I (internal hemorrhoids that do not prolapse).                           The exam was otherwise without abnormality on                            direct and retroflexion views. Complications:            No immediate complications. Estimated blood loss:                            None. Estimated Blood Loss:     Estimated blood loss: none. Impression:               - Two 8 mm polyps in the transverse colon, removed                            with a cold snare. Resected and retrieved.                           - Mild diverticulosis in the left colon.                           - Internal  hemorrhoids.                           - The examination was otherwise normal on direct                            and retroflexion views. Recommendation:           - Repeat colonoscopy after studies are complete for                            surveillance based on pathology results.                           - Patient has a contact number available for                            emergencies. The signs and symptoms of potential  delayed complications were discussed with the                            patient. Return to normal activities tomorrow.                            Written discharge instructions were provided to the                            patient.                           - Resume previous diet.                           - Continue present medications.                           - Await pathology results. Ladene Artist, MD 09/03/2022 8:21:39 AM This report has been signed electronically.

## 2022-09-03 NOTE — Progress Notes (Signed)
Pt's states no medical or surgical changes since previsit or office visit. 

## 2022-09-03 NOTE — Progress Notes (Signed)
History & Physical  Primary Care Physician:  Luetta Nutting, DO Primary Gastroenterologist: Lucio Edward, MD  CHIEF COMPLAINT:  Personal history of colon polyps   HPI: Douglas Shah is a 61 y.o. male with a personal history of adenomatous and sessile serrated colon polyps for surveillance colonoscopy.   Past Medical History:  Diagnosis Date   Arthritis    right foot   Chronic elbow pain left   Diverticulitis 04/28/2019   Confirm CT scan ED June 2020   Seasonal allergies    Sessile colonic polyp 07/20/2019    Past Surgical History:  Procedure Laterality Date   COLONOSCOPY  2020   MS-MAC-suprep (good)-tics/int hems/SSP x 1/TA x 2-3 yr recall   EXERCISE TREADMILL STRESS TEST  04/19/2008   LOW RISK/ NO ISCHEMIA   HIP ARTHROSCOPY W/ LABRAL DEBRIDEMENT  03/09/2011   RIGHT HIP   POLYPECTOMY  2020   TA x 2, SSP x 1   REPAIR EXTENSOR TENDON  08/15/2012   Procedure: REPAIR EXTENSOR TENDON;  Surgeon: Magnus Sinning, MD;  Location: [facility];  Service: Orthopedics;  Laterality: Left;  WITH PARTIAL LATERAL EPICONDYLECTOMY OF THE LEFT ELBOW    REPAIR EXTENSOR TENDON Right    RIGHT SHOULDER SURGERY  10/29/1998   XI ROBOTIC ASSISTED INGUINAL HERNIA REPAIR WITH MESH Right 01/02/2021   Procedure: XI ROBOTIC ASSISTED INGUINAL HERNIA REPAIR WITH MESH;  Surgeon: Greer Pickerel, MD;  Location: [facility];  Service: General;  Laterality: Right;  90 MINUTES IN ROOM 2    Prior to Admission medications   Medication Sig Start Date End Date Taking? Authorizing Provider  Cholecalciferol (VITAMIN D3 PO) Take 1,000 Int'l Units/day by mouth daily at 6 (six) AM.    [provider]  fluticasone (FLONASE) 50 MCG/ACT nasal spray Place 2 sprays into both nostrils daily.    [provider]  meloxicam (MOBIC) 15 MG tablet Take 1 tablet (15 mg total) by mouth daily. Patient taking differently: Take 15 mg by mouth daily as needed for pain. 07/23/22   Luetta Nutting, DO   Omega-3 Fatty Acids (FISH OIL PO) Take 2,150 mg by mouth daily at 6 (six) AM.    [provider]  pseudoephedrine (SUDAFED) 120 MG 12 hr tablet Take 120 mg by mouth daily as needed for congestion.    [provider]  sildenafil (VIAGRA) 100 MG tablet Take 1/2-1 tablet (50-100 mg total) by mouth daily as needed for erectile dysfunction. 01/18/22   Luetta Nutting, DO  vitamin E 180 MG (400 UNITS) capsule Take 400 Units by mouth daily.    [provider]    Current Outpatient Medications  Medication Sig Dispense Refill   Cholecalciferol (VITAMIN D3 PO) Take 1,000 Int'l Units/day by mouth daily at 6 (six) AM.     fluticasone (FLONASE) 50 MCG/ACT nasal spray Place 2 sprays into both nostrils daily.     meloxicam (MOBIC) 15 MG tablet Take 1 tablet (15 mg total) by mouth daily. (Patient taking differently: Take 15 mg by mouth daily as needed for pain.) 30 tablet 0   Omega-3 Fatty Acids (FISH OIL PO) Take 2,150 mg by mouth daily at 6 (six) AM.     pseudoephedrine (SUDAFED) 120 MG 12 hr tablet Take 120 mg by mouth daily as needed for congestion.     sildenafil (VIAGRA) 100 MG tablet Take 1/2-1 tablet (50-100 mg total) by mouth daily as needed for erectile dysfunction. 10 tablet 11   vitamin E 180 MG (400  UNITS) capsule Take 400 Units by mouth daily.     Current Facility-Administered Medications  Medication Dose Route Frequency Provider Last Rate Last Admin   0.9 %  sodium chloride infusion  500 mL Intravenous Once Ladene Artist, MD        Allergies as of 09/03/2022 - Review Complete 09/03/2022  Allergen Reaction Noted   Ciprofloxacin hcl Other (See Comments) 06/08/2022    Family History  Problem Relation Age of Onset   COPD Mother    Heart attack Father    Colon cancer Neg Hx    Colon polyps Neg Hx    Esophageal cancer Neg Hx    Rectal cancer Neg Hx    Stomach cancer Neg Hx     Social History   Socioeconomic History   Marital status: Married    Spouse  name: Not on file   Number of children: Not on file   Years of education: Not on file   Highest education level: Not on file  Occupational History   Not on file  Tobacco Use   Smoking status: Never   Smokeless tobacco: Current    Types: Snuff  Vaping Use   Vaping Use: Never used  Substance and Sexual Activity   Alcohol use: Not Currently    Alcohol/week: 0.0 - 3.0 standard drinks of alcohol   Drug use: No   Sexual activity: Yes  Other Topics Concern   Not on file  Social History Narrative   Not on file   Social Determinants of Health   Financial Resource Strain: Not on file  Food Insecurity: Not on file  Transportation Needs: Not on file  Physical Activity: Not on file  Stress: Not on file  Social Connections: Not on file  Intimate Partner Violence: Not on file    Review of Systems:  All systems reviewed were negative except where noted in HPI.   Physical Exam: General:  Alert, well-developed, in NAD Head:  Normocephalic and atraumatic. Eyes:  Sclera clear, no icterus.   Conjunctiva pink. Ears:  Normal auditory acuity. Mouth:  No deformity or lesions.  Neck:  Supple; no masses . Lungs:  Clear throughout to auscultation.   No wheezes, crackles, or rhonchi. No acute distress. Heart:  Regular rate and rhythm; no murmurs. Abdomen:  Soft, nondistended, nontender. No masses, hepatomegaly. No obvious masses.  Normal bowel .    Rectal:  Deferred   Msk:  Symmetrical without gross deformities.. Pulses:  Normal pulses noted. Extremities:  Without edema. Neurologic:  Alert and  oriented x4;  grossly normal neurologically. Skin:  Intact without significant lesions or rashes. Cervical Nodes:  No significant cervical adenopathy. Psych:  Alert and cooperative. Normal mood and affect.  Impression / Plan:   Personal history of adenomatous and sessile serrated colon polyps for surveillance colonoscopy.  Pricilla Riffle. [place] Plan  09/03/2022, 7:59 AM See Shea Evans, [facility], to contact  our on call provider

## 2022-09-03 NOTE — Patient Instructions (Signed)
Information on polyps and diverticulosis given to you today.  Await pathology results.  Resume previous diet and medications.   YOU HAD AN ENDOSCOPIC PROCEDURE TODAY AT THE [facility]:   Refer to the procedure report that was given to you for any specific questions about what was found during the examination.  If the procedure report does not answer your questions, please call your gastroenterologist to clarify.  If you requested that your care partner not be given the details of your procedure findings, then the procedure report has been included in a sealed envelope for you to review at your convenience later.  YOU SHOULD EXPECT: Some feelings of bloating in the abdomen. Passage of more gas than usual.  Walking can help get rid of the air that was put into your GI tract during the procedure and reduce the bloating. If you had a lower endoscopy (such as a colonoscopy or flexible sigmoidoscopy) you may notice spotting of blood in your stool or on the toilet paper. If you underwent a bowel prep for your procedure, you may not have a normal bowel movement for a few days.  Please Note:  You might notice some irritation and congestion in your nose or some drainage.  This is from the oxygen used during your procedure.  There is no need for concern and it should clear up in a day or so.  SYMPTOMS TO REPORT IMMEDIATELY:  Following lower endoscopy (colonoscopy or flexible sigmoidoscopy):  Excessive amounts of blood in the stool  Significant tenderness or worsening of abdominal pains  Swelling of the abdomen that is new, acute  Fever of 100F or higher  For urgent or emergent issues, a gastroenterologist can be reached at any hour by calling [phone]. Do not use MyChart messaging for urgent concerns.    DIET:  We do recommend a small meal at first, but then you may proceed to your regular diet.  Drink plenty of fluids but you should avoid alcoholic beverages for 24  hours.  ACTIVITY:  You should plan to take it easy for the rest of today and you should NOT DRIVE or use heavy machinery until tomorrow (because of the sedation medicines used during the test).    FOLLOW UP: Our staff will call the number listed on your records the next business day following your procedure.  We will call around 7:15- 8:00 am to check on you and address any questions or concerns that you may have regarding the information given to you following your procedure. If we do not reach you, we will leave a message.     If any biopsies were taken you will be contacted by phone or by letter within the next 1-3 weeks.  Please call us at [phone] if you have not heard about the biopsies in 3 weeks.    SIGNATURES/CONFIDENTIALITY: You and/or your care partner have signed paperwork which will be entered into your electronic medical record.  These signatures attest to the fact that that the information above on your After Visit Summary has been reviewed and is understood.  Full responsibility of the confidentiality of this discharge information lies with you and/or your care-partner. 

## 2022-09-03 NOTE — Progress Notes (Signed)
Sedate, gd SR, tolerated procedure well, VSS, report to RN 

## 2022-09-04 ENCOUNTER — Telehealth: Payer: Self-pay

## 2022-09-04 NOTE — Telephone Encounter (Signed)
  Follow up Call-     09/03/2022    7:08 AM  Call back number  Post procedure Call Back phone  # [phone]  Permission to leave phone message Yes     Patient questions:  Do you have a fever, pain , or abdominal swelling? No. Pain Score  0 *  Have you tolerated food without any problems? Yes.    Have you been able to return to your normal activities? Yes.    Do you have any questions about your discharge instructions: Diet   No. Medications  No. Follow up visit  No.  Do you have questions or concerns about your Care? No.  Actions: * If pain score is 4 or above: No action needed, pain <4.

## 2022-09-07 ENCOUNTER — Ambulatory Visit: Payer: BC Managed Care – PPO | Admitting: Sports Medicine

## 2022-09-07 ENCOUNTER — Ambulatory Visit: Payer: Self-pay

## 2022-09-07 VITALS — BP 136/82 | Ht 68.0 in | Wt 160.0 lb

## 2022-09-07 DIAGNOSIS — S62524D Nondisplaced fracture of distal phalanx of right thumb, subsequent encounter for fracture with routine healing: Secondary | ICD-10-CM

## 2022-09-07 NOTE — Progress Notes (Unsigned)
  Douglas Shah - 61 y.o. male MRN [id number]  Date of birth: 1961/06/28    CHIEF COMPLAINT:   R thumb distal phalanx fracture    SUBJECTIVE:   HPI:  Pleasant 61 year old comes to clinic to follow-up right thumb distal phalanx fracture.  4 weeks ago he fell off of a ladder.  He fell on his thumb.  He had a laceration to the thumb.  He was initially evaluated urgent care.  2 weeks later he was still in pain so was reevaluated urgent care and had x-ray that showed a transverse fracture of the distal phalanx.  He was placed in thumb spica for immobilization.  Today he is 4 weeks out from his injury.  He has been wearing a metal splint keeping the thumb in extension.  He found this more comfortable than the thumb spica. He reports the pain is much better.  It only hurts if he hits it against something.  He is not taking any medication.  Denies any numbness or tingling in the thumb.  ROS:     See HPI  PERTINENT  PMH / PSH FH / / SH:  Past Medical, Surgical, Social, and Family History Reviewed & Updated in the EMR.  Pertinent findings include:  none  OBJECTIVE: BP 136/82   Ht '5\' 8"'$  (1.727 m)   Wt 160 lb (72.6 kg)   BMI 24.33 kg/m   Physical Exam:  Vital signs are reviewed.  GEN: Alert and oriented, NAD Pulm: Breathing unlabored PSY: normal mood, congruent affect  MSK: R thumb - no obvious deformity.  No overlying erythema or ecchymoses.  No laceration.  He is mildly tender to palpation at the base of the distal phalanx.  No tenderness at IP joint.  He has full range of motion in flexion and extension.  5/5 strength throughout.  ULTRASOUND: Right thumb was visualized in both the long and short axis. The interphalangeal joint does not have any swelling. At the base of the distal phalanx there is a depression in the cortex. No cortical irregularities distally.   Impression: Healing transverse fracture of distal phalanx  ASSESSMENT & PLAN:  1.  Closed nondisplaced fracture of right  thumb distal phalanx with routine healing  -Patient seems to be healing well clinically today.  Pain is getting better.  We will have him continue the metal splint in extension for at least another 2 weeks, or until it is no longer painful.  We will have him follow-up in 2 to 3 weeks if not better.  Otherwise he can follow-up here as needed.  All questions were answered and he agrees to plan.  Dortha Kern, MD PGY-4, Sports Medicine Fellow [place]  Addendum:  Patient seen in the office by fellow.  His history, exam, plan of care were precepted with me.  Douglas Lemon MD Douglas Shah

## 2022-09-12 ENCOUNTER — Encounter: Payer: Self-pay | Admitting: Sports Medicine

## 2022-09-24 ENCOUNTER — Encounter: Payer: Self-pay | Admitting: Gastroenterology

## 2022-09-28 ENCOUNTER — Ambulatory Visit: Payer: BC Managed Care – PPO | Admitting: Family Medicine

## 2022-09-28 VITALS — BP 122/82 | Ht 68.0 in | Wt 160.0 lb

## 2022-09-28 DIAGNOSIS — S62524D Nondisplaced fracture of distal phalanx of right thumb, subsequent encounter for fracture with routine healing: Secondary | ICD-10-CM | POA: Diagnosis not present

## 2022-09-28 NOTE — Progress Notes (Signed)
  Douglas Shah - 61 y.o. male MRN [id number]  Date of birth: 1961-05-12    CHIEF COMPLAINT:   R thumb    SUBJECTIVE:   HPI:  Pleasant 61 year old male comes in for follow-up of right first distal phalanx fracture.  This has been treated nonoperatively with a splint.  He is about 8 weeks out from his injury now.  He is doing really well.  He had been wearing a splint pretty much around-the-clock.  He is ready to take this off now.  It is not painful anymore.  It is still little stiff.  He was able to put together patio eater without difficulty.  He is not taking any for pain.  ROS:     See HPI  PERTINENT  PMH / PSH FH / / SH:  Past Medical, Surgical, Social, and Family History Reviewed & Updated in the EMR.  Pertinent findings include:  Number  OBJECTIVE: BP 122/82   Ht '5\' 8"'$  (1.727 m)   Wt 160 lb (72.6 kg)   BMI 24.33 kg/m   Physical Exam:  Vital signs are reviewed.  GEN: Alert and oriented, NAD Pulm: Breathing unlabored PSY: normal mood, congruent affect  MSK: Right thumb -no obvious deformity.  Nontender to palpation along the distal phalanx, IP, proximal phalanx, MCP joint.  Full range of motion in all directions.  5/5 strength throughout.  He is able to ABduct the thumb up from a flat surface.  He is able to make an okay sign.  He is neurovascular intact distally.  ASSESSMENT & PLAN:  1.  Nondisplaced fracture of distal phalanx right thumb, subsequent encounter  -Patient has done really well with conservative treatment.  He can take off the splint now.  He can get back into activities as tolerated.  We discussed that it still may continue to be a little stiff and weak as he gets back to using it but that is expected and normal.  He will follow-up here as needed.  All questions asked and agrees to plan.  Dortha Kern, MD PGY-4, Sports Medicine Fellow [place]

## 2022-09-28 NOTE — Progress Notes (Signed)
[facility]: Attending Note: I have examined the patient, reviewed the chart, discussed the assessment and plan with the Sports Medicine Fellow. I agree with assessment and treatment plan as detailed in the Fellow's note.  

## 2022-11-09 ENCOUNTER — Ambulatory Visit
Admission: EM | Admit: 2022-11-09 | Discharge: 2022-11-09 | Disposition: A | Discharge status: Home / Self Care | Payer: BC Managed Care – PPO | Attending: Family Medicine | Admitting: Family Medicine

## 2022-11-09 ENCOUNTER — Encounter: Payer: Self-pay | Admitting: Emergency Medicine

## 2022-11-09 DIAGNOSIS — H00012 Hordeolum externum right lower eyelid: Secondary | ICD-10-CM

## 2022-11-09 MED ORDER — CEPHALEXIN 500 MG PO CAPS: 500.0000 mg | ORAL_CAPSULE | Freq: Two times a day (BID) | ORAL | 0 refills | Status: AC

## 2022-11-09 NOTE — ED Provider Notes (Signed)
[facility]    CSN: [id number] Arrival date & time: 11/09/22  1806      History   Chief Complaint Chief Complaint  Patient presents with   Eye Problem    HPI Douglas Shah is a 62 y.o. male.   HPI 62 year old presents with possible stye right lower eye lid.   Past Medical History:  Diagnosis Date   Arthritis    right foot   Chronic elbow pain left   Diverticulitis 04/28/2019   Confirm CT scan ED June 2020   Seasonal allergies    Sessile colonic polyp 07/20/2019    Patient Active Problem List   Diagnosis Date Noted   Nondisplaced fracture of distal phalanx of right thumb, initial encounter for closed fracture 08/24/2022   Swelling of first metatarsophalangeal (MTP) joint of right foot 07/23/2022   Sessile colonic polyp 07/20/2019   Diverticulitis 04/28/2019   BPH (benign prostatic hyperplasia) 07/16/2018   HLD (hyperlipidemia) 07/16/2018   ED (erectile dysfunction) 12/09/2017   AK (actinic keratosis) 07/15/2017   Snuff user 07/15/2017    Past Surgical History:  Procedure Laterality Date   COLONOSCOPY  2020   MS-MAC-suprep (good)-tics/int hems/SSP x 1/TA x 2-3 yr recall   EXERCISE TREADMILL STRESS TEST  04/19/2008   LOW RISK/ NO ISCHEMIA   HIP ARTHROSCOPY W/ LABRAL DEBRIDEMENT  03/09/2011   RIGHT HIP   POLYPECTOMY  2020   TA x 2, SSP x 1   REPAIR EXTENSOR TENDON  08/15/2012   Procedure: REPAIR EXTENSOR TENDON;  Surgeon: Magnus Sinning, MD;  Location: [facility];  Service: Orthopedics;  Laterality: Left;  WITH PARTIAL LATERAL EPICONDYLECTOMY OF THE LEFT ELBOW    REPAIR EXTENSOR TENDON Right    RIGHT SHOULDER SURGERY  10/29/1998   XI ROBOTIC ASSISTED INGUINAL HERNIA REPAIR WITH MESH Right 01/02/2021   Procedure: XI ROBOTIC ASSISTED INGUINAL HERNIA REPAIR WITH MESH;  Surgeon: Greer Pickerel, MD;  Location: [facility];  Service: General;  Laterality: Right;  90 MINUTES IN ROOM 2       Home Medications    Prior to Admission  medications   Medication Sig Start Date End Date Taking? Authorizing Provider  cephALEXin (KEFLEX) 500 MG capsule Take 1 capsule (500 mg total) by mouth 2 (two) times daily for 7 days. 11/09/22 11/16/22 Yes Eliezer Lofts, FNP  Cholecalciferol (VITAMIN D3 PO) Take 1,000 Int'l Units/day by mouth daily at 6 (six) AM.   Yes [provider]  fluticasone (FLONASE) 50 MCG/ACT nasal spray Place 2 sprays into both nostrils daily.   Yes [provider]  meloxicam (MOBIC) 15 MG tablet Take 1 tablet (15 mg total) by mouth daily. Patient taking differently: Take 15 mg by mouth daily as needed for pain. 07/23/22  Yes Luetta Nutting, DO  Omega-3 Fatty Acids (FISH OIL PO) Take 2,150 mg by mouth daily at 6 (six) AM.   Yes [provider]  sildenafil (VIAGRA) 100 MG tablet Take 1/2-1 tablet (50-100 mg total) by mouth daily as needed for erectile dysfunction. 01/18/22  Yes Luetta Nutting, DO  vitamin E 180 MG (400 UNITS) capsule Take 400 Units by mouth daily.   Yes [provider]  pseudoephedrine (SUDAFED) 120 MG 12 hr tablet Take 120 mg by mouth daily as needed for congestion.    [provider]    Family History Family History  Problem Relation Age of Onset   COPD Mother    Heart attack Father    Colon cancer Neg  Hx    Colon polyps Neg Hx    Esophageal cancer Neg Hx    Rectal cancer Neg Hx    Stomach cancer Neg Hx     Social History Social History   Tobacco Use   Smoking status: Never   Smokeless tobacco: Current    Types: Snuff  Vaping Use   Vaping Use: Never used  Substance Use Topics   Alcohol use: Not Currently    Alcohol/week: 0.0 - 3.0 standard drinks of alcohol   Drug use: No     Allergies   Ciprofloxacin hcl   Review of Systems Review of Systems  Eyes:        Stye of right lower eyelid x 1 day  All other systems reviewed and are negative.    Physical Exam Triage Vital Signs ED Triage Vitals  Enc Vitals Group     BP 11/09/22  1817 131/83     Pulse Rate 11/09/22 1817 89     Resp 11/09/22 1817 18     Temp 11/09/22 1817 98.4 F (36.9 C)     Temp Source 11/09/22 1817 Oral     SpO2 11/09/22 1817 97 %     Weight 11/09/22 1819 160 lb (72.6 kg)     Height 11/09/22 1819 _0  (1.727 m)     Head Circumference --      Peak Flow --      Pain Score 11/09/22 1819 0     Pain Loc --      Pain Edu? --      Excl. in [place]? --    No data found.  Updated Vital Signs BP 131/83 (BP Location: Right Arm)   Pulse 89   Temp 98.4 F (36.9 C) (Oral)   Resp 18   Ht _1  (1.727 m)   Wt 160 lb (72.6 kg)   SpO2 97%   BMI 24.33 kg/m   Visual Acuity Right Eye Distance:   Left Eye Distance:   Bilateral Distance:    Right Eye Near:   Left Eye Near:    Bilateral Near:     Physical Exam Vitals and nursing note reviewed.  Constitutional:      Appearance: Normal appearance. He is normal weight.  HENT:     Head: Normocephalic and atraumatic.     Mouth/Throat:     Mouth: Mucous membranes are moist.     Pharynx: Oropharynx is clear.  Eyes:     General:        Right eye: Hordeolum present.     Extraocular Movements: Extraocular movements intact.     Conjunctiva/sclera: Conjunctivae normal.     Pupils: Pupils are equal, round, and reactive to light.      Comments: Tiny hordeolum internum noted  Cardiovascular:     Rate and Rhythm: Normal rate and regular rhythm.     Pulses: Normal pulses.     Heart sounds: Normal heart sounds.  Pulmonary:     Effort: Pulmonary effort is normal.     Breath sounds: Normal breath sounds. No wheezing, rhonchi or rales.  Musculoskeletal:        General: Normal range of motion.     Cervical back: Normal range of motion and neck supple.  Skin:    General: Skin is warm and dry.  Neurological:     General: No focal deficit present.     Mental Status: He is alert and oriented to person, place, and time.  UC Treatments / Results  Labs (all labs ordered are listed, but only abnormal  results are displayed) Labs Reviewed - No data to display  EKG   Radiology No results found.  Procedures Procedures (including critical care time)  Medications Ordered in UC Medications - No data to display  Initial Impression / Assessment and Plan / UC Course  I have reviewed the triage vital signs and the nursing notes.  Pertinent labs & imaging results that were available during my care of the patient were reviewed by me and considered in my medical decision making (see chart for details).     MDM: 1.  Hordeolum right lower eyelid, unspecified hordeolum type-Rx'd Keflex. Patient to take medication as directed with food to completion.  Encouraged increase daily water intake while taking this medication. Advised if symptoms worsen and/or unresolved please follow-up with optometry or here for further evaluation.  Patient discharged home, hemodynamically stable.  Final Clinical Impressions(s) / UC Diagnoses   Final diagnoses:  Hordeolum of right lower eyelid, unspecified hordeolum type     Discharge Instructions      Patient to take medication as directed with food to completion.  Encouraged increase daily water intake while taking this medication. Advised if symptoms worsen and/or unresolved please follow-up with optometry or here for further evaluation.     ED Prescriptions     Medication Sig Dispense Auth. Provider   cephALEXin (KEFLEX) 500 MG capsule Take 1 capsule (500 mg total) by mouth 2 (two) times daily for 7 days. 14 capsule Eliezer Lofts, FNP      PDMP not reviewed this encounter.   Eliezer Lofts, [address] [place] 11/09/22 1839

## 2022-11-09 NOTE — ED Triage Notes (Signed)
Patient states that he has a stye on his right eye that he noticed this evening.  Denies any blurred vision.  Patient does wear contact lenses.

## 2022-11-09 NOTE — Discharge Instructions (Addendum)
Patient to take medication as directed with food to completion.  Encouraged increase daily water intake while taking this medication. Advised if symptoms worsen and/or unresolved please follow-up with optometry or here for further evaluation.

## 2022-11-19 DIAGNOSIS — M65342 Trigger finger, left ring finger: Secondary | ICD-10-CM | POA: Diagnosis not present

## 2023-02-15 ENCOUNTER — Other Ambulatory Visit: Payer: Self-pay | Admitting: Family Medicine

## 2023-04-01 DIAGNOSIS — M7542 Impingement syndrome of left shoulder: Secondary | ICD-10-CM | POA: Diagnosis not present

## 2023-05-29 DIAGNOSIS — B360 Pityriasis versicolor: Secondary | ICD-10-CM | POA: Diagnosis not present

## 2023-07-01 IMAGING — US US SOFT TISSUE HEAD/NECK
1 series · 13 of 13 positions shown · non-contrast
Comparison: None.

CLINICAL DATA: Neck nodule

EXAM:
ULTRASOUND OF HEAD/NECK SOFT TISSUES
TECHNIQUE: Ultrasound examination of the head and neck soft tissues was
performed in the area of clinical concern.

[Series 1: us soft tissue head/neck · 13 acquisitions, 13 frames shown]
[im 1/13]
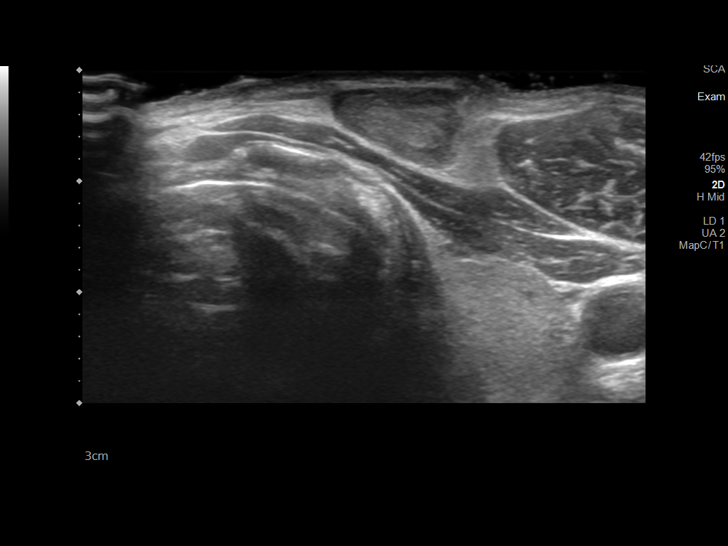
[im 2/13]
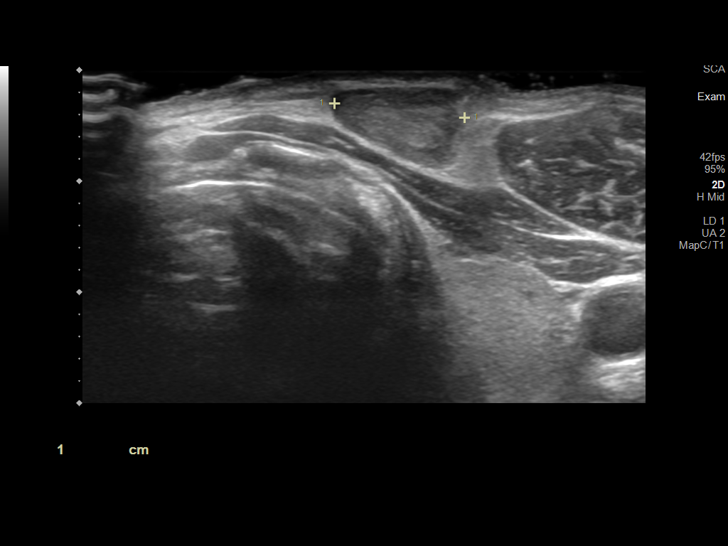
[im 3/13]
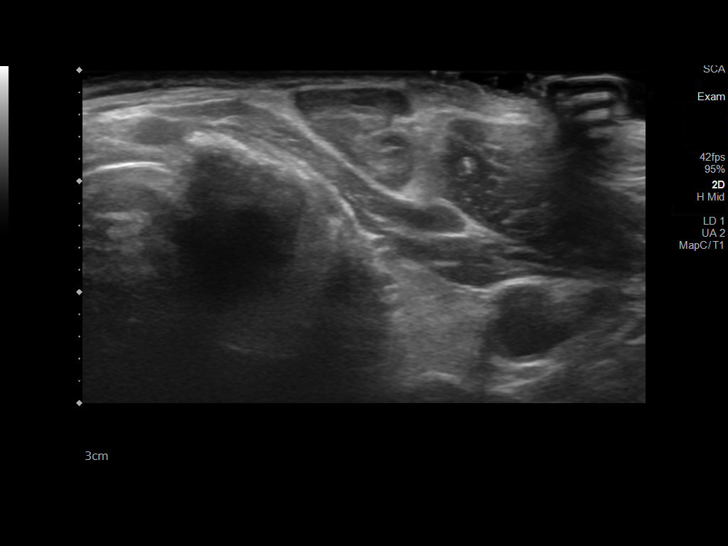
[im 4/13]
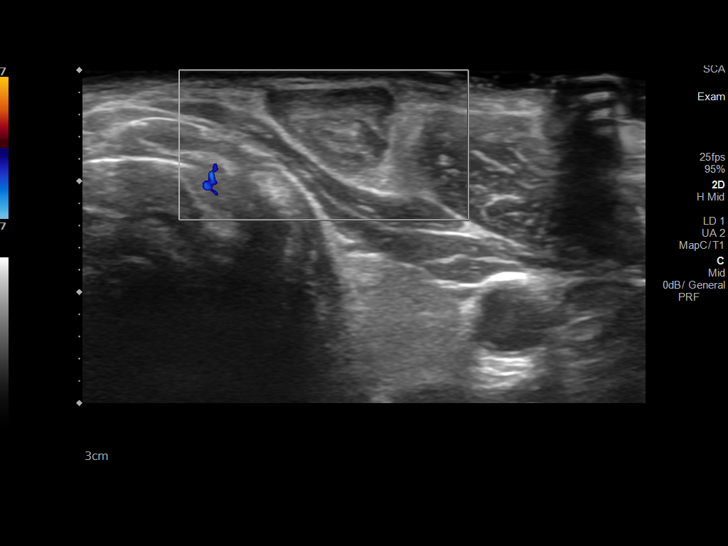
[im 5/13]
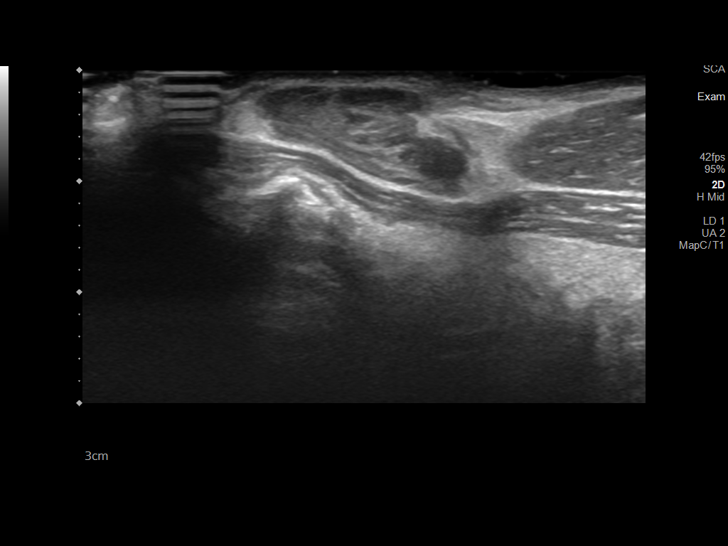
[im 6/13]
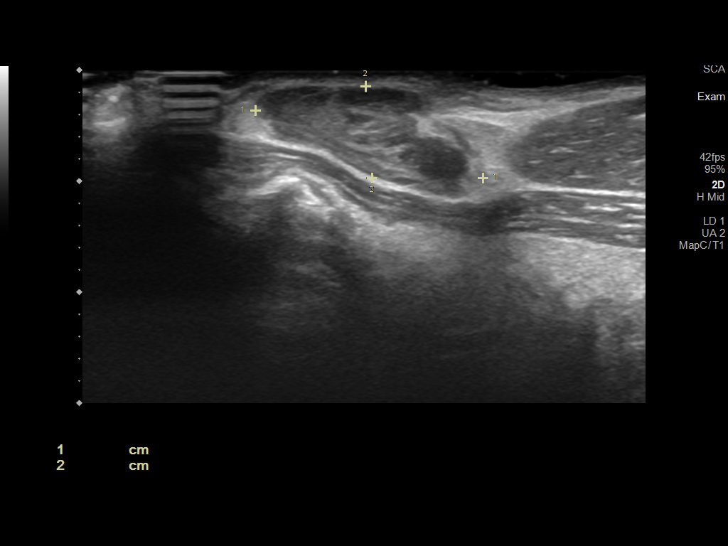
[im 7/13]
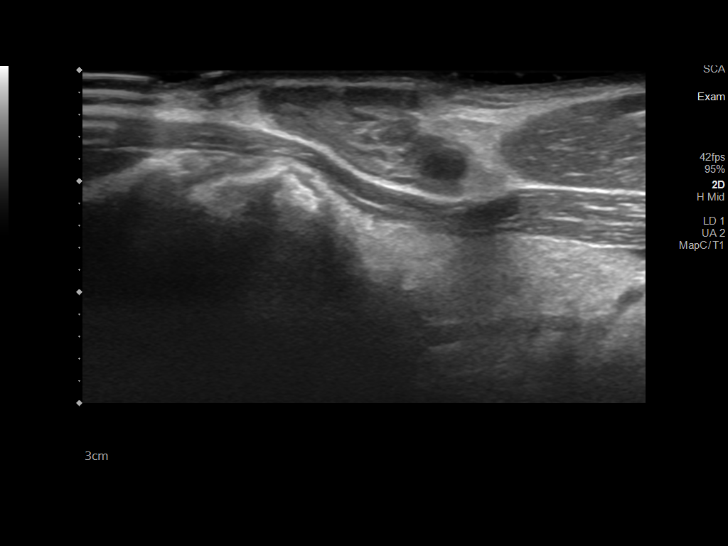
[im 8/13]
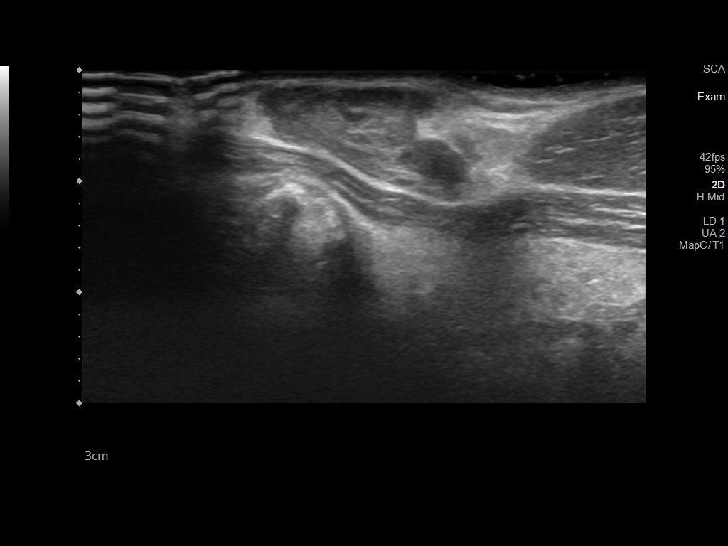
[im 9/13]
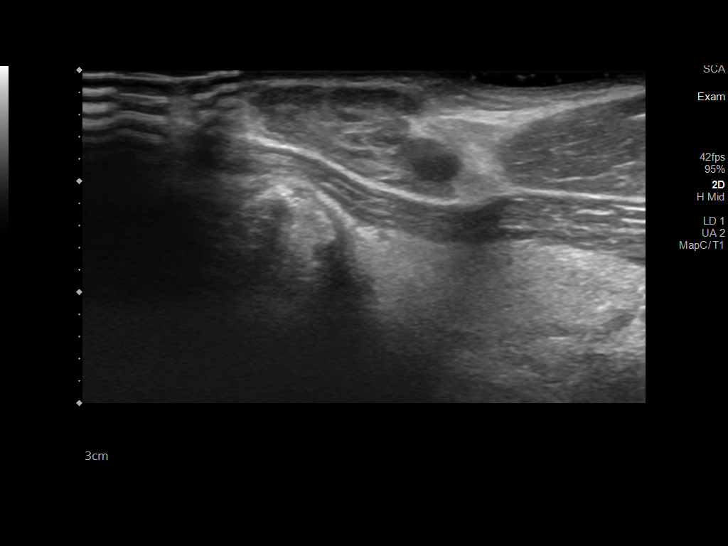
[im 10/13]
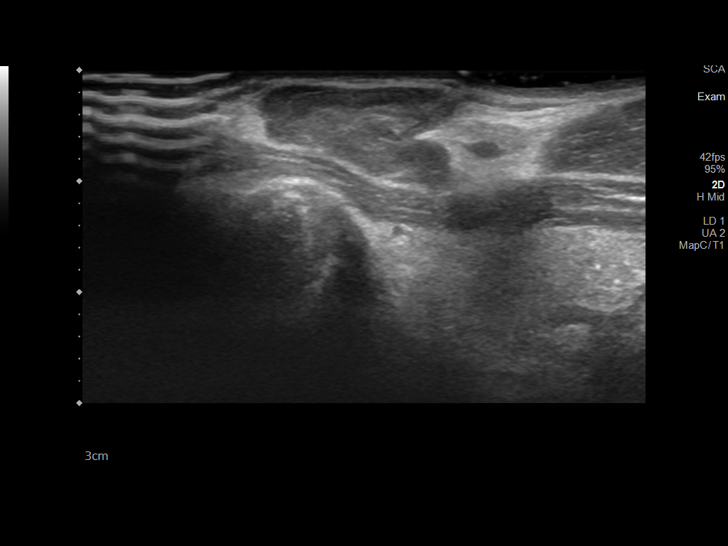
[im 11/13]
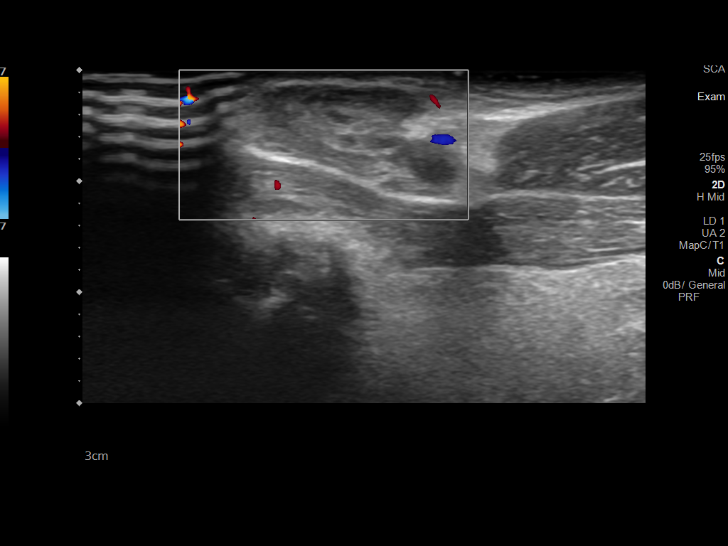
[im 12/13]
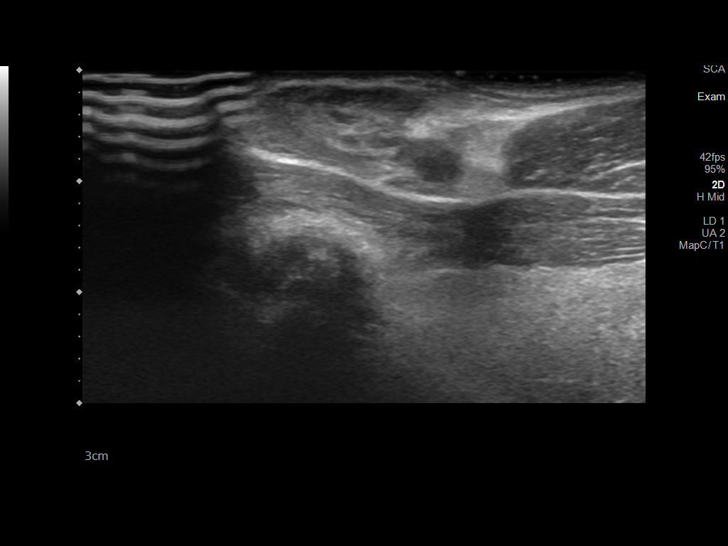
[im 13/13]
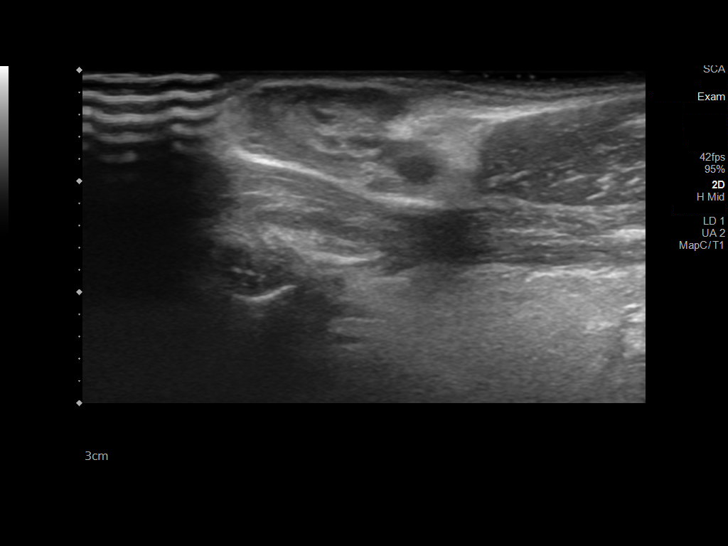

[13 of 13 positions shown; findings below may reference images not displayed]

FINDINGS: The palpable complaint reflects a ovoid nodule measuring 21 x 8 x 12
mm. The mass is subcutaneous along the anterior border of the
sternocleidomastoid, superficial to the strap muscles at the level
of the upper pole left thyroid gland. The architecture suggest lymph
node. No altered vascularity or adjacent inflammation seen.
IMPRESSION: Palpable nodule has the appearance of an enlarged jugular chain
lymph node which is nonspecific. A neck CT with contrast would be
contributory in evaluating for underlying inflammation or mass.

## 2023-07-05 IMAGING — CT CT NECK W/ CM
3 of 8 series · 7 of 33 positions shown, 8 images · IV contrast (omnipaque)
Comparison: Soft tissue ultrasound neck 05/05/2021

CLINICAL DATA: Left neck nodule.

EXAM:
CT NECK WITH CONTRAST
TECHNIQUE: Multidetector CT imaging of the neck was performed using the
standard protocol following the bolus administration of intravenous
contrast.
CONTRAST:  75mL OMNIPAQUE IOHEXOL 300 MG/ML  SOLN

[Series 5: cor neck · coronal · 0.47mm/px · 1 of 114 slices shown]
[im 57/114  bone]
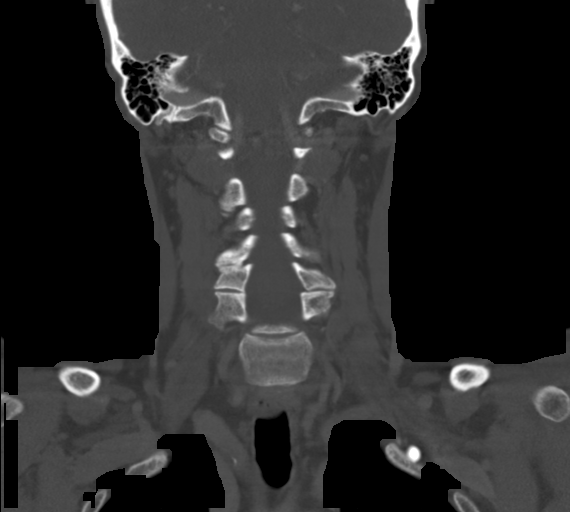

[Series 6: orthogonal (person_name) · axial · 0.39mm/px · z∈[+1346,+1346]mm · 1 of 120 slices shown, 2 images]
[im 72/120  soft-tissue]
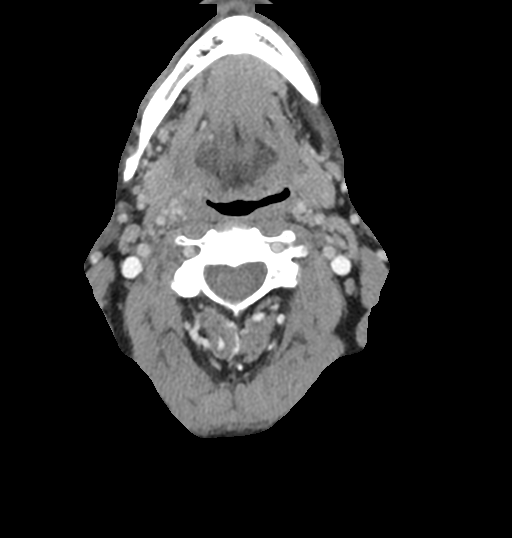
[im 72/120  bone]
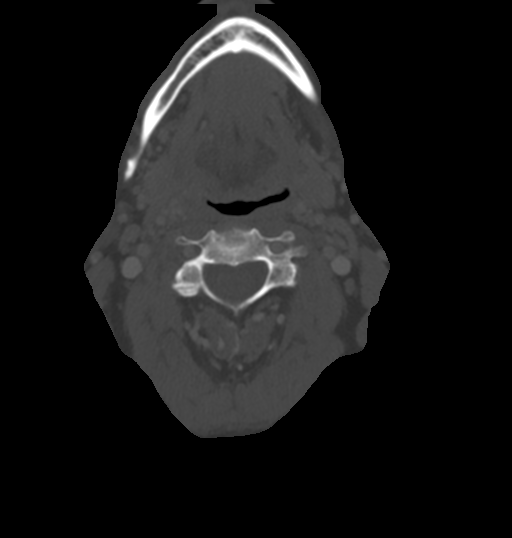

[Series 10: sag neck · sagittal · 0.12mm/px · 5 of 119 slices shown]
[im 20/119  bone]
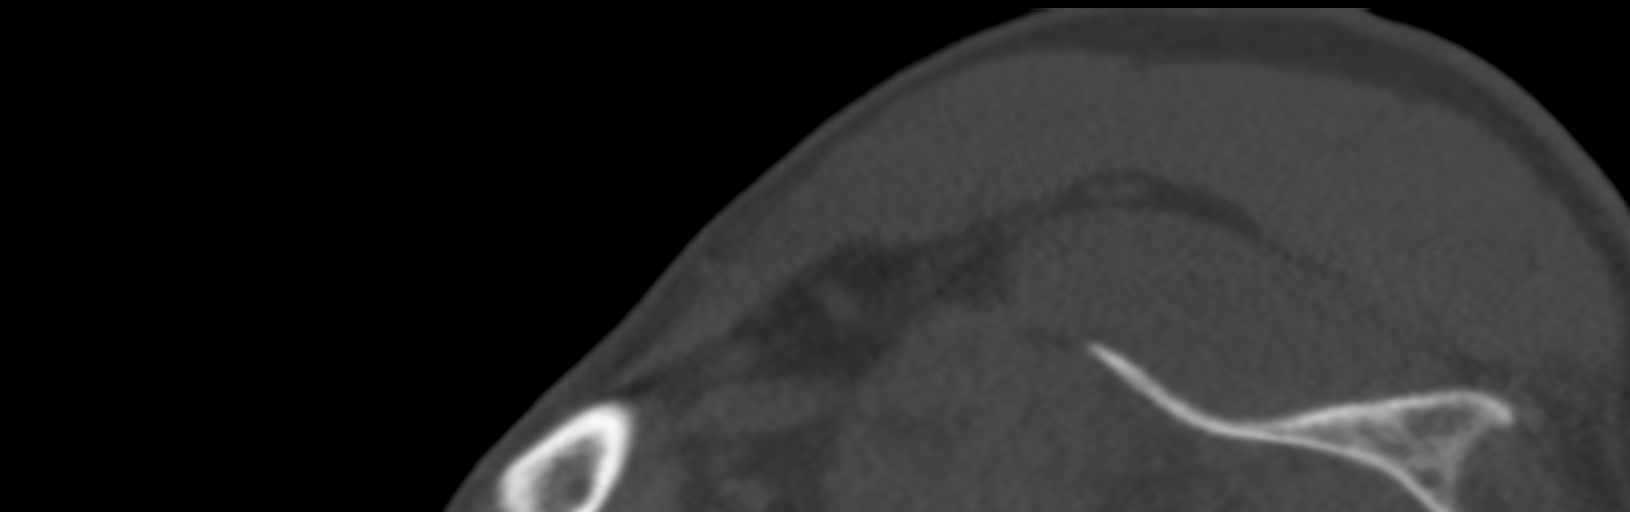
[im 40/119  bone]
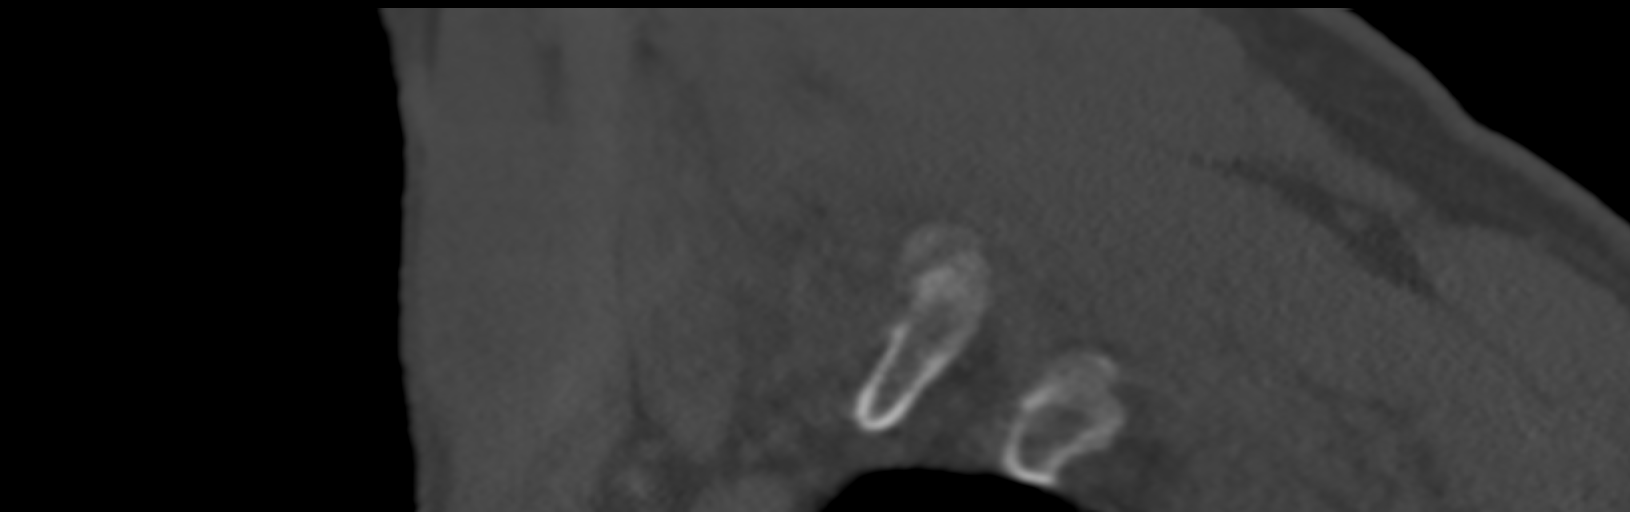
[im 60/119  bone]
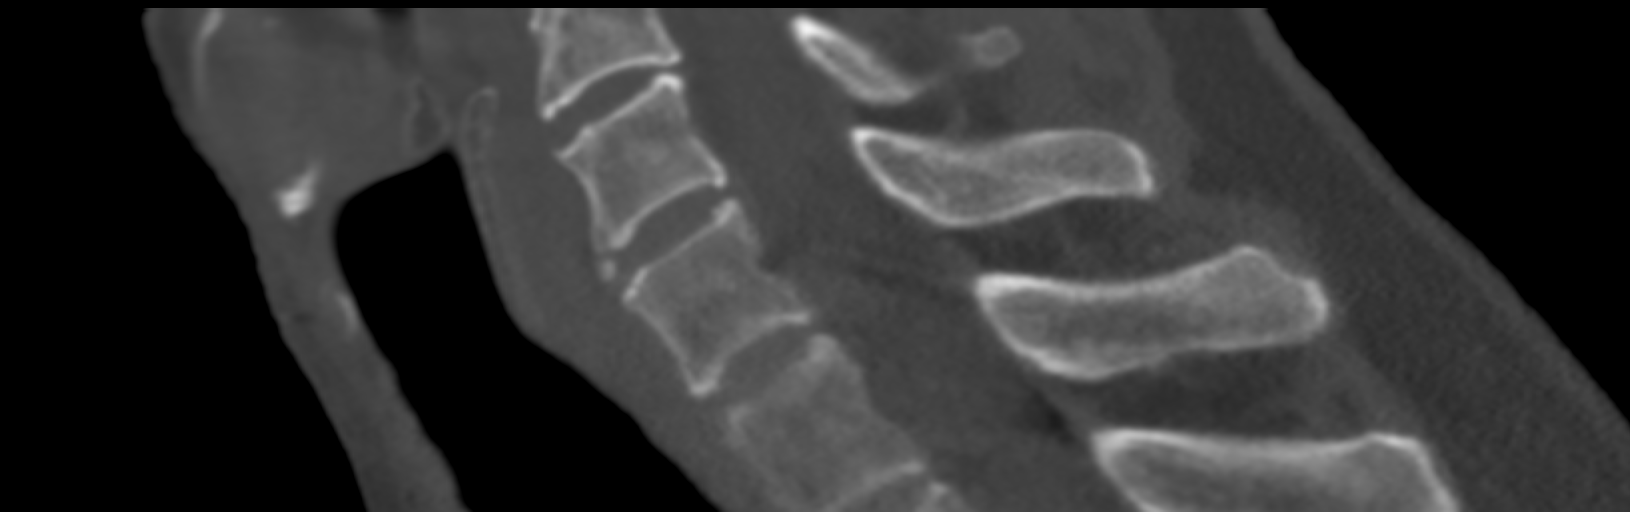
[im 79/119  bone]
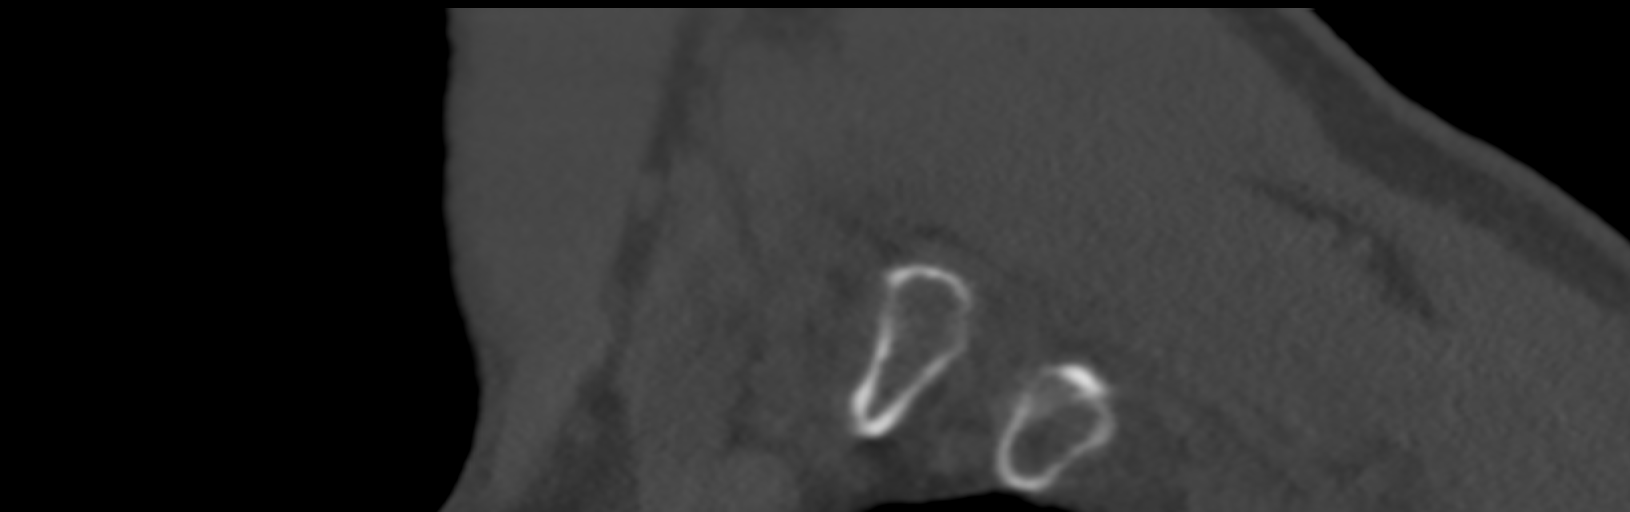
[im 99/119  bone]
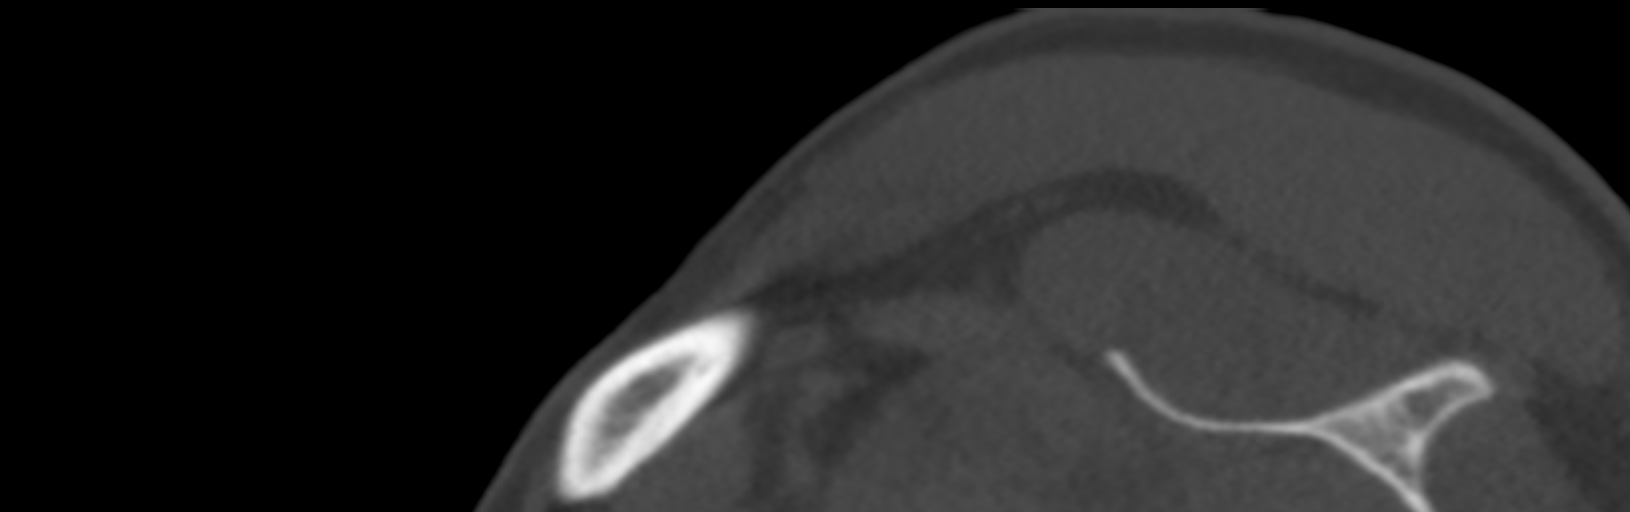

[7 of 33 positions shown; findings below may reference images not displayed]

FINDINGS: Pharynx and larynx: Normal. No mass or swelling.

Salivary glands: No inflammation, mass, or stone.

Thyroid: Normal size.  No focal abnormality.

Lymph nodes: Subcutaneous nodule in the left anterior neck
corresponds to the palpable abnormality and the abnormality
identified by ultrasound. This appears to be a solid soft tissue
nodule in the subcutaneous tissues measuring approximately 8 x 12 x
16 mm. This is at the level of the mid thyroid and anterior to the
strap muscle and sternocleidomastoid muscle.

Subcentimeter level 2 lymph nodes bilaterally

Vascular: Normal vascular enhancement.

Limited intracranial: Negative

Visualized orbits: Negative

Mastoids and visualized paranasal sinuses: Paranasal sinuses clear.
Mastoid clear.

Skeleton: Negative

Upper chest: Lung apices clear bilaterally.

Other: None
IMPRESSION: 1. Palpable abnormality in the left anterior neck corresponds to a
subcutaneous nodule which appears to be a soft tissue density. This
corresponds to the ultrasound abnormality. Imaging appearance is
nonspecific but this is likely a lymph node. Consider biopsy
especially if this has recently enlarged or developed. No other
enlarged lymph nodes in the neck.
2. Normal pharynx and larynx.

## 2023-10-16 DIAGNOSIS — M25551 Pain in right hip: Secondary | ICD-10-CM | POA: Diagnosis not present

## 2023-10-17 DIAGNOSIS — M25551 Pain in right hip: Secondary | ICD-10-CM | POA: Diagnosis not present

## 2023-12-15 ENCOUNTER — Ambulatory Visit (INDEPENDENT_AMBULATORY_CARE_PROVIDER_SITE_OTHER): Payer: BC Managed Care – PPO

## 2023-12-15 ENCOUNTER — Ambulatory Visit
Admission: EM | Admit: 2023-12-15 | Discharge: 2023-12-15 | Disposition: A | Discharge status: Home / Self Care | Payer: BC Managed Care – PPO | Attending: Family Medicine | Admitting: Family Medicine

## 2023-12-15 ENCOUNTER — Encounter: Payer: Self-pay | Admitting: Emergency Medicine

## 2023-12-15 DIAGNOSIS — R059 Cough, unspecified: Secondary | ICD-10-CM

## 2023-12-15 DIAGNOSIS — J019 Acute sinusitis, unspecified: Secondary | ICD-10-CM

## 2023-12-15 DIAGNOSIS — R0989 Other specified symptoms and signs involving the circulatory and respiratory systems: Secondary | ICD-10-CM | POA: Diagnosis not present

## 2023-12-15 DIAGNOSIS — J209 Acute bronchitis, unspecified: Secondary | ICD-10-CM

## 2023-12-15 DIAGNOSIS — R051 Acute cough: Secondary | ICD-10-CM | POA: Diagnosis not present

## 2023-12-15 MED ORDER — DOXYCYCLINE HYCLATE 100 MG PO CAPS: 100.0000 mg | ORAL_CAPSULE | Freq: Two times a day (BID) | ORAL | 0 refills | Status: DC

## 2023-12-15 MED ORDER — PREDNISONE 20 MG PO TABS: 40.0000 mg | ORAL_TABLET | Freq: Every day | ORAL | 0 refills | Status: DC

## 2023-12-15 MED ORDER — PROMETHAZINE-DM 6.25-15 MG/5ML PO SYRP: 5.0000 mL | ORAL_SOLUTION | Freq: Four times a day (QID) | ORAL | 0 refills | Status: DC | PRN

## 2023-12-15 NOTE — ED Triage Notes (Signed)
 Patient c/o sinus pain, productive cough, nasal drainage x 1 week.  Pressure behind his eyes.  Patient denies any OTC cold meds.

## 2023-12-15 NOTE — Discharge Instructions (Addendum)
 Take the antibiotic 2 x a day.  It is important to take this medicine with food Take the prednisone once a day for 5 days Drink lots of water May take the stronger cough medicine at night.  During the day use regular mucinex or delsym Call for problems We will call you when the radiologist has issued a formal x ray reading

## 2023-12-15 NOTE — ED Provider Notes (Signed)
 [facility]    CSN: [id number] Arrival date & time: 12/15/23  0802      History   Chief Complaint Chief Complaint  Patient presents with   Cough    HPI Douglas Shah is a 63 y.o. male.   HPI  Pleasant 63 year old gentleman who says he is here with sinus and chest infection.  It has been going on for over a week.  Initially thought he might have a flu, he has some bodyaches and fatigue lot of sinus drainage and postnasal drip.  Now he has continued sinus drainage, green and thick, also cough and coughing up phlegm and mucus.  Feels very tired.  Cough is keeping him awake at night.  He does have some underlying allergies.  No underlying lung disease or asthma or COPD  Past Medical History:  Diagnosis Date   Arthritis    right foot   Chronic elbow pain left   Diverticulitis 04/28/2019   Confirm CT scan ED June 2020   Seasonal allergies    Sessile colonic polyp 07/20/2019    Patient Active Problem List   Diagnosis Date Noted   Nondisplaced fracture of distal phalanx of right thumb, initial encounter for closed fracture 08/24/2022   Swelling of first metatarsophalangeal (MTP) joint of right foot 07/23/2022   Sessile colonic polyp 07/20/2019   Diverticulitis 04/28/2019   BPH (benign prostatic hyperplasia) 07/16/2018   HLD (hyperlipidemia) 07/16/2018   ED (erectile dysfunction) 12/09/2017   AK (actinic keratosis) 07/15/2017   Snuff user 07/15/2017    Past Surgical History:  Procedure Laterality Date   COLONOSCOPY  2020   MS-MAC-suprep (good)-tics/int hems/SSP x 1/TA x 2-3 yr recall   EXERCISE TREADMILL STRESS TEST  04/19/2008   LOW RISK/ NO ISCHEMIA   HIP ARTHROSCOPY W/ LABRAL DEBRIDEMENT  03/09/2011   RIGHT HIP   POLYPECTOMY  2020   TA x 2, SSP x 1   REPAIR EXTENSOR TENDON  08/15/2012   Procedure: REPAIR EXTENSOR TENDON;  Surgeon: Drucilla Schmidt, MD;  Location: [facility];  Service: Orthopedics;  Laterality: Left;  WITH PARTIAL  LATERAL EPICONDYLECTOMY OF THE LEFT ELBOW    REPAIR EXTENSOR TENDON Right    RIGHT SHOULDER SURGERY  10/29/1998   XI ROBOTIC ASSISTED INGUINAL HERNIA REPAIR WITH MESH Right 01/02/2021   Procedure: XI ROBOTIC ASSISTED INGUINAL HERNIA REPAIR WITH MESH;  Surgeon: Gaynelle Adu, MD;  Location: [facility];  Service: General;  Laterality: Right;  90 MINUTES IN ROOM 2       Home Medications    Prior to Admission medications   Medication Sig Start Date End Date Taking? Authorizing Provider  Cholecalciferol (VITAMIN D3 PO) Take 1,000 Int'l Units/day by mouth daily at 6 (six) AM.   Yes [provider]  Omega-3 Fatty Acids (FISH OIL PO) Take 2,150 mg by mouth daily at 6 (six) AM.   Yes [provider]  sildenafil (VIAGRA) 100 MG tablet Take 1/2-1 tablet (50-100 mg total) by mouth daily as needed for erectile dysfunction. 02/15/23  Yes Everrett Coombe, DO  doxycycline (VIBRAMYCIN) 100 MG capsule Take 1 capsule (100 mg total) by mouth 2 (two) times daily. 12/15/23  Yes Eustace Moore, MD  meloxicam (MOBIC) 15 MG tablet Take 1 tablet (15 mg total) by mouth daily. Patient taking differently: Take 15 mg by mouth daily as needed for pain. 07/23/22   Everrett Coombe, DO  predniSONE (DELTASONE) 20 MG tablet Take 2 tablets (40 mg total) by mouth daily  with breakfast. 12/15/23  Yes Eustace Moore, MD  promethazine-dextromethorphan (PROMETHAZINE-DM) 6.25-15 MG/5ML syrup Take 5 mLs by mouth 4 (four) times daily as needed for cough. 12/15/23  Yes Eustace Moore, MD    Family History Family History  Problem Relation Age of Onset   COPD Mother    Heart attack Father    Colon cancer Neg Hx    Colon polyps Neg Hx    Esophageal cancer Neg Hx    Rectal cancer Neg Hx    Stomach cancer Neg Hx     Social History Social History   Tobacco Use   Smoking status: Never   Smokeless tobacco: Current    Types: Snuff  Vaping Use   Vaping status: Never Used  Substance Use Topics   Alcohol use:  Not Currently    Alcohol/week: 0.0 - 3.0 standard drinks of alcohol   Drug use: No     Allergies   Ciprofloxacin hcl   Review of Systems Review of Systems See HPI  Physical Exam Triage Vital Signs ED Triage Vitals  Encounter Vitals Group     BP 12/15/23 0820 125/86     Systolic BP Percentile --      Diastolic BP Percentile --      Pulse Rate 12/15/23 0820 88     Resp 12/15/23 0820 18     Temp 12/15/23 0820 98.5 F (36.9 C)     Temp Source 12/15/23 0820 Oral     SpO2 12/15/23 0820 98 %     Weight 12/15/23 0822 160 lb (72.6 kg)     Height 12/15/23 0822 5\' 8"  (1.727 m)     Head Circumference --      Peak Flow --      Pain Score 12/15/23 0822 0     Pain Loc --      Pain Education --      Exclude from Growth Chart --    No data found.  Updated Vital Signs BP 125/86 (BP Location: Right Arm)   Pulse 88   Temp 98.5 F (36.9 C) (Oral)   Resp 18   Ht 5\' 8"  (1.727 m)   Wt 72.6 kg   SpO2 98%   BMI 24.33 kg/m       Physical Exam Constitutional:      General: He is not in acute distress.    Appearance: He is well-developed. He is ill-appearing.  HENT:     Head: Normocephalic and atraumatic.     Right Ear: Tympanic membrane and ear canal normal.     Left Ear: Tympanic membrane and ear canal normal.     Nose: Nose normal.     Mouth/Throat:     Mouth: Mucous membranes are moist.     Pharynx: No posterior oropharyngeal erythema.  Eyes:     Conjunctiva/sclera: Conjunctivae normal.     Pupils: Pupils are equal, round, and reactive to light.  Cardiovascular:     Rate and Rhythm: Normal rate and regular rhythm.     Heart sounds: Normal heart sounds.  Pulmonary:     Effort: Pulmonary effort is normal. No respiratory distress.     Breath sounds: Rhonchi present.     Comments: Coarse rhonchi throughout both lungs Abdominal:     General: There is no distension.     Palpations: Abdomen is soft.  Musculoskeletal:        General: Normal range of motion.     Cervical  back: Normal range of motion.  Lymphadenopathy:     Cervical: No cervical adenopathy.  Skin:    General: Skin is warm and dry.  Neurological:     Mental Status: He is alert.      UC Treatments / Results  Labs (all labs ordered are listed, but only abnormal results are displayed) Labs Reviewed - No data to display  EKG   Radiology DG Chest 2 View Result Date: 12/15/2023 CLINICAL DATA:  63 year old male with history of cough and congestion for the past 1 and half weeks. EXAM: CHEST - 2 VIEW COMPARISON:  Chest x-ray 05/11/2016. FINDINGS: Lung volumes are normal. No consolidative airspace disease. No pleural effusions. No pneumothorax. No pulmonary nodule or mass noted. Pulmonary vasculature and the cardiomediastinal silhouette are within normal limits. IMPRESSION: No radiographic evidence of acute cardiopulmonary disease. Electronically Signed   By: Trudie Reed M.D.   On: 12/15/2023 08:55  ADDENDUM: The radiologist reading came in just as the patient was being discharged.  I was able to reach him in the lobby before he left and discussed with him.  Procedures Procedures (including critical care time)  Medications Ordered in UC Medications - No data to display  Initial Impression / Assessment and Plan / UC Course  I have reviewed the triage vital signs and the nursing notes.  Pertinent labs & imaging results that were available during my care of the patient were reviewed by me and considered in my medical decision making (see chart for details).     Final Clinical Impressions(s) / UC Diagnoses   Final diagnoses:  Acute cough  Acute sinusitis with symptoms > 10 days  Acute bronchitis, unspecified organism     Discharge Instructions      Take the antibiotic 2 x a day.  It is important to take this medicine with food Take the prednisone once a day for 5 days Drink lots of water May take the stronger cough medicine at night.  During the day use regular mucinex or  delsym Call for problems We will call you when the radiologist has issued a formal x ray reading     ED Prescriptions     Medication Sig Dispense Auth. Provider   doxycycline (VIBRAMYCIN) 100 MG capsule Take 1 capsule (100 mg total) by mouth 2 (two) times daily. 20 capsule Eustace Moore, MD   predniSONE (DELTASONE) 20 MG tablet Take 2 tablets (40 mg total) by mouth daily with breakfast. 10 tablet Eustace Moore, MD   promethazine-dextromethorphan (PROMETHAZINE-DM) 6.25-15 MG/5ML syrup Take 5 mLs by mouth 4 (four) times daily as needed for cough. 118 mL Eustace Moore, MD      PDMP not reviewed this encounter.   Eustace Moore, MD 12/15/23 [phone]

## 2024-01-07 DIAGNOSIS — H16041 Marginal corneal ulcer, right eye: Secondary | ICD-10-CM | POA: Diagnosis not present

## 2024-01-10 DIAGNOSIS — H16041 Marginal corneal ulcer, right eye: Secondary | ICD-10-CM | POA: Diagnosis not present

## 2024-01-23 DIAGNOSIS — H5213 Myopia, bilateral: Secondary | ICD-10-CM | POA: Diagnosis not present

## 2024-08-05 ENCOUNTER — Other Ambulatory Visit: Payer: Self-pay | Admitting: Family Medicine

## 2024-08-10 NOTE — Telephone Encounter (Signed)
*  Pt scheduled this Wednesday, requesting refill now.

## 2024-08-10 NOTE — Telephone Encounter (Signed)
 Pt scheduled Wednesday.

## 2024-08-12 ENCOUNTER — Ambulatory Visit: Admitting: Family Medicine

## 2024-08-12 ENCOUNTER — Encounter: Payer: Self-pay | Admitting: Family Medicine

## 2024-08-12 VITALS — BP 133/81 | HR 81 | Ht 68.0 in | Wt 165.0 lb

## 2024-08-12 DIAGNOSIS — R351 Nocturia: Secondary | ICD-10-CM | POA: Diagnosis not present

## 2024-08-12 DIAGNOSIS — N529 Male erectile dysfunction, unspecified: Secondary | ICD-10-CM

## 2024-08-12 DIAGNOSIS — N401 Enlarged prostate with lower urinary tract symptoms: Secondary | ICD-10-CM

## 2024-08-12 DIAGNOSIS — E782 Mixed hyperlipidemia: Secondary | ICD-10-CM | POA: Diagnosis not present

## 2024-08-12 MED ORDER — SILDENAFIL CITRATE 100 MG PO TABS: 100.0000 mg | ORAL_TABLET | ORAL | 5 refills | Status: AC | PRN

## 2024-08-12 NOTE — Assessment & Plan Note (Signed)
 Updated PSA level ordered.  Denies any symptoms.

## 2024-08-12 NOTE — Assessment & Plan Note (Signed)
Updated lipid panel ordered. 

## 2024-08-12 NOTE — Progress Notes (Signed)
 Douglas Shah - 63 y.o. male MRN [id number]  Date of birth: 1961-03-18  Subjective Chief Complaint  Patient presents with   Medication Refill    HPI Douglas Shah is a 63 year old male here today for follow-up visit.  Reports he is doing quite well at this time.  Denies new concerns today.  He does need a refill on sildenafil .  He is doing well with his current strength.  No side effects.  Due for updated labs.  He does remain on fish oil for history of elevated triglycerides.  ROS:  A comprehensive ROS was completed and negative except as noted per HPI  Allergies  Allergen Reactions   Ciprofloxacin  Hcl Other (See Comments)    Joint and muscle issues    Past Medical History:  Diagnosis Date   Arthritis    right foot   Chronic elbow pain left   Diverticulitis 04/28/2019   Confirm CT scan ED June 2020   Seasonal allergies    Sessile colonic polyp 07/20/2019    Past Surgical History:  Procedure Laterality Date   COLONOSCOPY  2020   MS-MAC-suprep (good)-tics/int hems/SSP x 1/TA x 2-3 yr recall   EXERCISE TREADMILL STRESS TEST  04/19/2008   LOW RISK/ NO ISCHEMIA   HIP ARTHROSCOPY W/ LABRAL DEBRIDEMENT  03/09/2011   RIGHT HIP   POLYPECTOMY  2020   TA x 2, SSP x 1   REPAIR EXTENSOR TENDON  08/15/2012   Procedure: REPAIR EXTENSOR TENDON;  Surgeon: Lynwood SHAUNNA Bern, MD;  Location: [facility];  Service: Orthopedics;  Laterality: Left;  WITH PARTIAL LATERAL EPICONDYLECTOMY OF THE LEFT ELBOW    REPAIR EXTENSOR TENDON Right    RIGHT SHOULDER SURGERY  10/29/1998   XI ROBOTIC ASSISTED INGUINAL HERNIA REPAIR WITH MESH Right 01/02/2021   Procedure: XI ROBOTIC ASSISTED INGUINAL HERNIA REPAIR WITH MESH;  Surgeon: Tanda Locus, MD;  Location: [facility];  Service: General;  Laterality: Right;  90 MINUTES IN ROOM 2    Social History   Socioeconomic History   Marital status: Married    Spouse name: Not on file   Number of children: Not on file   Years of  education: Not on file   Highest education level: Not on file  Occupational History   Not on file  Tobacco Use   Smoking status: Never   Smokeless tobacco: Current    Types: Snuff  Vaping Use   Vaping status: Never Used  Substance and Sexual Activity   Alcohol use: Not Currently    Alcohol/week: 0.0 - 3.0 standard drinks of alcohol   Drug use: No   Sexual activity: Yes  Other Topics Concern   Not on file  Social History Narrative   Not on file   Social Drivers of Health   Financial Resource Strain: Not on file  Food Insecurity: Not on file  Transportation Needs: Not on file  Physical Activity: Not on file  Stress: Not on file  Social Connections: Unknown (04/13/2022)   Received from [facility]   Social Network    Social Network: Not on file    Family History  Problem Relation Age of Onset   COPD Mother    Heart attack Father    Colon cancer Neg Hx    Colon polyps Neg Hx    Esophageal cancer Neg Hx    Rectal cancer Neg Hx    Stomach cancer Neg Hx     Health Maintenance  Topic Date Due  Zoster Vaccines- Shingrix (1 of 2) 11/12/2024 (Originally 07/23/1980)   Influenza Vaccine  01/26/2025 (Originally 05/29/2024)   Pneumococcal Vaccine: 50+ Years (1 of 1 - PCV) 08/12/2025 (Originally 07/24/2011)   COVID-19 Vaccine (2 - Pfizer risk series) 08/28/2025 (Originally 03/04/2020)   DTaP/Tdap/Td (2 - Td or Tdap) 10/18/2025   Colonoscopy  09/03/2029   Hepatitis C Screening  Completed   HIV Screening  Completed   Hepatitis B Vaccines 19-59 Average Risk  Aged Out   HPV VACCINES  Aged Out   Meningococcal B Vaccine  Aged Out     ----------------------------------------------------------------------------------------------------------------------------------------------------------------------------------------------------------------- Physical Exam BP 133/81 (BP Location: Left Arm, Patient Position: Sitting, Cuff Size: Normal)   Pulse 81   Ht 5' 8 (1.727 m)   Wt 165 lb  (74.8 kg)   SpO2 98%   BMI 25.09 kg/m   Physical Exam Constitutional:      Appearance: Normal appearance.  HENT:     Head: Normocephalic and atraumatic.  Eyes:     General: No scleral icterus. Cardiovascular:     Rate and Rhythm: Normal rate and regular rhythm.  Pulmonary:     Effort: Pulmonary effort is normal.     Breath sounds: Normal breath sounds.  Musculoskeletal:     Cervical back: Neck supple.  Neurological:     Mental Status: He is alert.  Psychiatric:        Mood and Affect: Mood normal.        Behavior: Behavior normal.     ------------------------------------------------------------------------------------------------------------------------------------------------------------------------------------------------------------------- Assessment and Plan  BPH (benign prostatic hyperplasia) Updated PSA level ordered.  Denies any symptoms.  HLD (hyperlipidemia) Updated lipid panel ordered.  ED (erectile dysfunction) Sildenafil  is working well for him.  Will continue at current dose. Reminded of precautions with this medication.    Meds ordered this encounter  Medications   sildenafil  (VIAGRA ) 100 MG tablet    Sig: Take 1 tablet (100 mg total) by mouth as needed for erectile dysfunction.    Dispense:  10 tablet    Refill:  5    No follow-ups on file.

## 2024-08-12 NOTE — Assessment & Plan Note (Signed)
Sildenafil is working well for him.  Will continue at current dose. Reminded of precautions with this medication.

## 2024-09-26 ENCOUNTER — Ambulatory Visit
Admission: EM | Admit: 2024-09-26 | Discharge: 2024-09-26 | Disposition: A | Discharge status: Home / Self Care | Attending: Internal Medicine | Admitting: Internal Medicine

## 2024-09-26 ENCOUNTER — Other Ambulatory Visit: Payer: Self-pay

## 2024-09-26 DIAGNOSIS — H18892 Other specified disorders of cornea, left eye: Secondary | ICD-10-CM | POA: Diagnosis not present

## 2024-09-26 MED ORDER — MOXIFLOXACIN HCL 0.5 % OP SOLN: 1.0000 [drp] | Freq: Three times a day (TID) | OPHTHALMIC | 0 refills | Status: AC

## 2024-09-26 NOTE — ED Provider Notes (Signed)
 [facility]    CSN: [id number] Arrival date & time: 09/26/24  0804      History   Chief Complaint Chief Complaint  Patient presents with   Eye Problem    HPI TRYGG MANTZ is a 63 y.o. male.   Patient presents with feelings of left eye irritation and watery drainage that started yesterday. Denies injury or foreign body. Denies vision changes. He does wear contacts and occasionally sleeps in them, but he reports that he has the kind of contacts that he is allowed to sleep in. He is concerned for a stye to the eye. He states that he had similar symptoms in march and was prescribed moxifloxacin and prednisolone eye drops by his eye doctor with improvement.   Eye Problem   Past Medical History:  Diagnosis Date   Arthritis    right foot   Chronic elbow pain left   Diverticulitis 04/28/2019   Confirm CT scan ED June 2020   Seasonal allergies    Sessile colonic polyp 07/20/2019    Patient Active Problem List   Diagnosis Date Noted   Nondisplaced fracture of distal phalanx of right thumb, initial encounter for closed fracture 08/24/2022   Swelling of first metatarsophalangeal (MTP) joint of right foot 07/23/2022   Sessile colonic polyp 07/20/2019   Diverticulitis 04/28/2019   BPH (benign prostatic hyperplasia) 07/16/2018   HLD (hyperlipidemia) 07/16/2018   ED (erectile dysfunction) 12/09/2017   AK (actinic keratosis) 07/15/2017   Snuff user 07/15/2017    Past Surgical History:  Procedure Laterality Date   COLONOSCOPY  2020   MS-MAC-suprep (good)-tics/int hems/SSP x 1/TA x 2-3 yr recall   EXERCISE TREADMILL STRESS TEST  04/19/2008   LOW RISK/ NO ISCHEMIA   HIP ARTHROSCOPY W/ LABRAL DEBRIDEMENT  03/09/2011   RIGHT HIP   POLYPECTOMY  2020   TA x 2, SSP x 1   REPAIR EXTENSOR TENDON  08/15/2012   Procedure: REPAIR EXTENSOR TENDON;  Surgeon: Lynwood SHAUNNA Bern, MD;  Location: [facility];  Service: Orthopedics;  Laterality: Left;  WITH  PARTIAL LATERAL EPICONDYLECTOMY OF THE LEFT ELBOW    REPAIR EXTENSOR TENDON Right    RIGHT SHOULDER SURGERY  10/29/1998   XI ROBOTIC ASSISTED INGUINAL HERNIA REPAIR WITH MESH Right 01/02/2021   Procedure: XI ROBOTIC ASSISTED INGUINAL HERNIA REPAIR WITH MESH;  Surgeon: Tanda Locus, MD;  Location: [facility];  Service: General;  Laterality: Right;  90 MINUTES IN ROOM 2       Home Medications    Prior to Admission medications   Medication Sig Start Date End Date Taking? Authorizing Provider  moxifloxacin (VIGAMOX) 0.5 % ophthalmic solution Place 1 drop into the left eye 3 (three) times daily for 7 days. 09/26/24 10/03/24 Yes Taahir Grisby, Darryle BRAVO, FNP  Cholecalciferol (VITAMIN D3 PO) Take 1,000 Int'l Units/day by mouth daily at 6 (six) AM.    [provider]  meloxicam  (MOBIC ) 15 MG tablet Take 1 tablet (15 mg total) by mouth daily. Patient taking differently: Take 15 mg by mouth daily as needed for pain. 07/23/22   Alvia Bring, DO  Omega-3 Fatty Acids (FISH OIL PO) Take 2,150 mg by mouth daily at 6 (six) AM.    [provider]  sildenafil  (VIAGRA ) 100 MG tablet Take 1 tablet (100 mg total) by mouth as needed for erectile dysfunction. 08/12/24   Alvia Bring, DO    Family History Family History  Problem Relation Age of Onset   COPD Mother  Heart attack Father    Colon cancer Neg Hx    Colon polyps Neg Hx    Esophageal cancer Neg Hx    Rectal cancer Neg Hx    Stomach cancer Neg Hx     Social History Social History   Tobacco Use   Smoking status: Never   Smokeless tobacco: Current    Types: Snuff  Vaping Use   Vaping status: Never Used  Substance Use Topics   Alcohol use: Not Currently    Alcohol/week: 0.0 - 3.0 standard drinks of alcohol   Drug use: No     Allergies   Ciprofloxacin  hcl   Review of Systems Review of Systems Per HPI  Physical Exam Triage Vital Signs ED Triage Vitals  Encounter Vitals Group     BP 09/26/24 0812 (!) 157/74      Girls Systolic BP Percentile --      Girls Diastolic BP Percentile --      Boys Systolic BP Percentile --      Boys Diastolic BP Percentile --      Pulse Rate 09/26/24 0812 88     Resp 09/26/24 0812 16     Temp 09/26/24 0812 98.2 F (36.8 C)     Temp src --      SpO2 09/26/24 0812 97 %     Weight --      Height --      Head Circumference --      Peak Flow --      Pain Score 09/26/24 0815 0     Pain Loc --      Pain Education --      Exclude from Growth Chart --    No data found.  Updated Vital Signs BP (!) 157/74   Pulse 88   Temp 98.2 F (36.8 C)   Resp 16   SpO2 97%   Visual Acuity Right Eye Distance:   Left Eye Distance:   Bilateral Distance:    Right Eye Near:   Left Eye Near:    Bilateral Near:     Physical Exam Constitutional:      General: He is not in acute distress.    Appearance: Normal appearance. He is not toxic-appearing or diaphoretic.  HENT:     Head: Normocephalic and atraumatic.  Eyes:     General: Lids are normal. Lids are everted, no foreign bodies appreciated. Vision grossly intact. Gaze aligned appropriately.        Right eye: No hordeolum.        Left eye: No hordeolum.     Extraocular Movements: Extraocular movements intact.     Conjunctiva/sclera: Conjunctivae normal.     Pupils: Pupils are equal, round, and reactive to light.     Left eye: No corneal abrasion or fluorescein uptake.     Comments: Scleral redness throughout left eye. No obvious fluoroscein reuptake noted.   Pulmonary:     Effort: Pulmonary effort is normal.  Neurological:     General: No focal deficit present.     Mental Status: He is alert and oriented to person, place, and time. Mental status is at baseline.  Psychiatric:        Mood and Affect: Mood normal.        Behavior: Behavior normal.        Thought Content: Thought content normal.        Judgment: Judgment normal.      UC Treatments / Results  Labs (all labs  ordered are listed, but only abnormal  results are displayed) Labs Reviewed - No data to display  EKG   Radiology No results found.  Procedures Procedures (including critical care time)  Medications Ordered in UC Medications - No data to display  Initial Impression / Assessment and Plan / UC Course  I have reviewed the triage vital signs and the nursing notes.  Pertinent labs & imaging results that were available during my care of the patient were reviewed by me and considered in my medical decision making (see chart for details).     Differential diagnoses include bacterial conjunctivitis, contact lens keratitis, corneal abrasion, corneal ulcer. Physical exam is not definitive for ulcer or abrasion given no obvious fluorescein reuptake. It appears per chart review that patient was treated for corneal ulcer of the right eye in march. Although, I am not able to see full ophthalmology notes. Will treat with moxifloxacin eye drops to cover for several etiologies given patient is a contact lens wearer. He has allergy to cipro  but has had moxifloxacin eyedrops previously and tolerated well. Visual acuity appears baseline. Patient advised to keep his contact lenses out until otherwise advised. He was advised to follow up with his eye doctor on Monday for further evaluation and management. Advised strict return precautions. Patient verbalized understanding and was agreeable with plan.  Final Clinical Impressions(s) / UC Diagnoses   Final diagnoses:  Corneal irritation of left eye     Discharge Instructions      I have sent antibiotic eyedrops to treat infection of your left eye. Please follow up with your eye doctor on Monday. Keep your contact lenses out and wear your glasses until otherwise advised.     ED Prescriptions     Medication Sig Dispense Auth. Provider   moxifloxacin (VIGAMOX) 0.5 % ophthalmic solution Place 1 drop into the left eye 3 (three) times daily for 7 days. 3 mL Hazen Darryle BRAVO, [place]      PDMP not  reviewed this encounter.   Hazen Darryle BRAVO, [place] 09/26/24 [phone]

## 2024-09-26 NOTE — ED Triage Notes (Signed)
 C/o Stye to left eye, was irritated yesterday. Has had drainage out of eye. Had one to his right eye a few months ago. Was previously on prednisolone eye drops, moxifloxacin eye drops.

## 2024-09-26 NOTE — Discharge Instructions (Signed)
 I have sent antibiotic eyedrops to treat infection of your left eye. Please follow up with your eye doctor on Monday. Keep your contact lenses out and wear your glasses until otherwise advised.

## 2024-09-27 ENCOUNTER — Telehealth: Payer: Self-pay

## 2024-09-27 NOTE — Telephone Encounter (Signed)
Called to check on patient. No answer. Left voicemail.

## 2024-09-30 DIAGNOSIS — H00012 Hordeolum externum right lower eyelid: Secondary | ICD-10-CM | POA: Diagnosis not present

## 2024-10-07 DIAGNOSIS — H0012 Chalazion right lower eyelid: Secondary | ICD-10-CM | POA: Diagnosis not present
